# Patient Record
Sex: Female | Born: 1935
Health system: Southern US, Community
[De-identification: ages and names within clinical notes are randomized; demographics above are authoritative.]

## PROBLEM LIST (undated history)

## (undated) DIAGNOSIS — R55 Syncope and collapse: Secondary | ICD-10-CM

## (undated) DIAGNOSIS — E042 Nontoxic multinodular goiter: Secondary | ICD-10-CM

## (undated) DIAGNOSIS — D72819 Decreased white blood cell count, unspecified: Secondary | ICD-10-CM

## (undated) DIAGNOSIS — I10 Essential (primary) hypertension: Secondary | ICD-10-CM

## (undated) DIAGNOSIS — D649 Anemia, unspecified: Secondary | ICD-10-CM

## (undated) DIAGNOSIS — K5792 Diverticulitis of intestine, part unspecified, without perforation or abscess without bleeding: Secondary | ICD-10-CM

## (undated) DIAGNOSIS — G4730 Sleep apnea, unspecified: Secondary | ICD-10-CM

## (undated) DIAGNOSIS — E785 Hyperlipidemia, unspecified: Secondary | ICD-10-CM

## (undated) DIAGNOSIS — L84 Corns and callosities: Secondary | ICD-10-CM

## (undated) DIAGNOSIS — F419 Anxiety disorder, unspecified: Secondary | ICD-10-CM

## (undated) DIAGNOSIS — G4710 Hypersomnia, unspecified: Secondary | ICD-10-CM

## (undated) DIAGNOSIS — Z973 Presence of spectacles and contact lenses: Secondary | ICD-10-CM

## (undated) DIAGNOSIS — K219 Gastro-esophageal reflux disease without esophagitis: Secondary | ICD-10-CM

## (undated) HISTORY — DX: Nontoxic multinodular goiter: E04.2

## (undated) HISTORY — DX: Hypersomnia, unspecified: G47.10

## (undated) HISTORY — DX: Gastro-esophageal reflux disease without esophagitis: K21.9

## (undated) HISTORY — DX: Corns and callosities: L84

## (undated) HISTORY — DX: Sleep apnea, unspecified: G47.30

## (undated) HISTORY — PX: CATARACT EXTRACTION: SUR2

## (undated) HISTORY — PX: TOTAL ABDOMINAL HYSTERECTOMY: SHX209

## (undated) HISTORY — DX: Diverticulitis of intestine, part unspecified, without perforation or abscess without bleeding: K57.92

## (undated) HISTORY — DX: Anxiety disorder, unspecified: F41.9

## (undated) HISTORY — DX: Essential (primary) hypertension: I10

## (undated) HISTORY — DX: Decreased white blood cell count, unspecified: D72.819

## (undated) HISTORY — DX: Hyperlipidemia, unspecified: E78.5

## (undated) HISTORY — DX: Syncope and collapse: R55

## (undated) HISTORY — DX: Anemia, unspecified: D64.9

---

## 1989-03-30 HISTORY — PX: FOOT SURGERY: SHX648

## 1997-11-27 LAB — HM MAMMOGRAPHY

## 1998-04-28 ENCOUNTER — Ambulatory Visit (HOSPITAL_COMMUNITY): Admission: RE | Admit: 1998-04-28 | Discharge: 1998-04-28 | Payer: Self-pay | Admitting: Gastroenterology

## 1998-04-29 LAB — HM COLONOSCOPY

## 1998-05-11 ENCOUNTER — Emergency Department (HOSPITAL_COMMUNITY): Admission: EM | Admit: 1998-05-11 | Discharge: 1998-05-11 | Payer: Self-pay | Admitting: Emergency Medicine

## 2000-09-11 ENCOUNTER — Other Ambulatory Visit: Admission: RE | Admit: 2000-09-11 | Discharge: 2000-09-11 | Payer: Self-pay | Admitting: Internal Medicine

## 2001-11-19 ENCOUNTER — Encounter: Payer: Self-pay | Admitting: Family Medicine

## 2001-11-19 ENCOUNTER — Encounter: Admission: RE | Admit: 2001-11-19 | Discharge: 2001-11-19 | Payer: Self-pay | Admitting: Family Medicine

## 2004-07-04 ENCOUNTER — Ambulatory Visit: Payer: Self-pay | Admitting: Internal Medicine

## 2004-08-16 ENCOUNTER — Ambulatory Visit: Payer: Self-pay | Admitting: Internal Medicine

## 2005-02-10 ENCOUNTER — Ambulatory Visit: Payer: Self-pay | Admitting: Family Medicine

## 2005-03-01 ENCOUNTER — Ambulatory Visit: Payer: Self-pay | Admitting: Internal Medicine

## 2005-04-04 ENCOUNTER — Ambulatory Visit: Payer: Self-pay | Admitting: Internal Medicine

## 2005-05-02 ENCOUNTER — Ambulatory Visit: Payer: Self-pay | Admitting: Internal Medicine

## 2005-06-01 ENCOUNTER — Ambulatory Visit: Payer: Self-pay | Admitting: Internal Medicine

## 2005-07-02 ENCOUNTER — Ambulatory Visit: Payer: Self-pay | Admitting: Internal Medicine

## 2005-11-09 ENCOUNTER — Ambulatory Visit: Payer: Self-pay | Admitting: Internal Medicine

## 2005-11-19 ENCOUNTER — Ambulatory Visit: Payer: Self-pay | Admitting: Cardiovascular Disease

## 2005-12-10 ENCOUNTER — Encounter: Payer: Self-pay | Admitting: Cardiology

## 2005-12-10 ENCOUNTER — Ambulatory Visit: Payer: Self-pay

## 2005-12-10 ENCOUNTER — Encounter: Payer: Self-pay | Admitting: Internal Medicine

## 2006-02-14 ENCOUNTER — Emergency Department (HOSPITAL_COMMUNITY): Admission: EM | Admit: 2006-02-14 | Discharge: 2006-02-14 | Payer: Self-pay | Admitting: Emergency Medicine

## 2006-02-15 ENCOUNTER — Ambulatory Visit: Payer: Self-pay | Admitting: Internal Medicine

## 2006-03-11 ENCOUNTER — Ambulatory Visit: Payer: Self-pay | Admitting: Internal Medicine

## 2006-03-20 ENCOUNTER — Ambulatory Visit: Payer: Self-pay | Admitting: Internal Medicine

## 2006-06-26 ENCOUNTER — Ambulatory Visit: Payer: Self-pay | Admitting: Internal Medicine

## 2006-06-26 LAB — CONVERTED CEMR LAB
ALT: 15 units/L (ref 0–40)
AST: 26 units/L (ref 0–37)
Basophils Absolute: 0 10*3/uL (ref 0.0–0.1)
Basophils Relative: 1.2 % — ABNORMAL HIGH (ref 0.0–1.0)
Chol/HDL Ratio, serum: 6
Cholesterol: 304 mg/dL (ref 0–200)
Creatinine, Ser: 1.1 mg/dL (ref 0.4–1.2)
Eosinophil percent: 3.3 % (ref 0.0–5.0)
Glucose, Bld: 99 mg/dL (ref 70–99)
HCT: 38 % (ref 36.0–46.0)
HDL: 50.5 mg/dL (ref >39.0)
Hemoglobin: 12.4 g/dL (ref 12.0–15.0)
LDL DIRECT: 224.3 mg/dL
Lymphocytes Relative: 47.5 % — ABNORMAL HIGH (ref 12.0–46.0)
MCHC: 32.6 g/dL (ref 30.0–36.0)
MCV: 84.4 fL (ref 78.0–100.0)
Monocytes Absolute: 0.4 10*3/uL (ref 0.2–0.7)
Monocytes Relative: 10.8 % (ref 3.0–11.0)
Neutro Abs: 1.3 10*3/uL — ABNORMAL LOW (ref 1.4–7.7)
Neutrophils Relative %: 37.2 % — ABNORMAL LOW (ref 43.0–77.0)
Platelets: 206 10*3/uL (ref 150–400)
RBC: 4.5 M/uL (ref 3.87–5.11)
RDW: 13.5 % (ref 11.5–14.6)
TSH: 2.01 microintl units/mL (ref 0.35–5.50)
Triglyceride fasting, serum: 116 mg/dL (ref 0–149)
VLDL: 23 mg/dL (ref 0–40)
Vitamin B-12: 582 pg/mL (ref 211–911)
WBC: 3.5 10*3/uL — ABNORMAL LOW (ref 4.5–10.5)

## 2006-08-13 ENCOUNTER — Ambulatory Visit: Payer: Self-pay | Admitting: Internal Medicine

## 2006-08-13 LAB — CONVERTED CEMR LAB
ALT: 20 units/L (ref 0–40)
AST: 31 units/L (ref 0–37)
Cholesterol: 198 mg/dL (ref 0–200)
HDL: 45.8 mg/dL (ref >39.0)
LDL Cholesterol: 119 mg/dL — ABNORMAL HIGH (ref 0–99)
Total CHOL/HDL Ratio: 4.3
Triglycerides: 168 mg/dL — ABNORMAL HIGH (ref 0–149)
VLDL: 34 mg/dL (ref 0–40)

## 2006-09-13 ENCOUNTER — Ambulatory Visit: Payer: Self-pay | Admitting: Internal Medicine

## 2006-10-10 ENCOUNTER — Ambulatory Visit: Payer: Self-pay | Admitting: Internal Medicine

## 2006-12-18 ENCOUNTER — Ambulatory Visit: Payer: Self-pay | Admitting: Internal Medicine

## 2006-12-18 LAB — CONVERTED CEMR LAB
Albumin ELP: 59.8 % (ref 55.8–66.1)
Alpha-1-Globulin: 3.6 % (ref 2.9–4.9)
Alpha-2-Globulin: 8.4 % (ref 7.1–11.8)
BUN: 9 mg/dL (ref 6–23)
Basophils Absolute: 0 10*3/uL (ref 0.0–0.1)
Basophils Relative: 1.1 % — ABNORMAL HIGH (ref 0.0–1.0)
Beta Globulin: 6.1 % (ref 4.7–7.2)
CO2: 32 meq/L (ref 19–32)
Calcium: 9.4 mg/dL (ref 8.4–10.5)
Chloride: 110 meq/L (ref 96–112)
Creatinine, Ser: 1 mg/dL (ref 0.4–1.2)
Eosinophils Absolute: 0.1 10*3/uL (ref 0.0–0.6)
Eosinophils Relative: 4.3 % (ref 0.0–5.0)
GFR calc Af Amer: 69 mL/min
GFR calc non Af Amer: 57 mL/min
Gamma Globulin: 16.1 % (ref 11.1–18.8)
Glucose, Bld: 83 mg/dL (ref 70–99)
HCT: 34.8 % — ABNORMAL LOW (ref 36.0–46.0)
Hemoglobin: 11.6 g/dL — ABNORMAL LOW (ref 12.0–15.0)
IgA: 312 mg/dL (ref 68–378)
IgG (Immunoglobin G), Serum: 1190 mg/dL (ref 694–1618)
IgM, Serum: 42 mg/dL — ABNORMAL LOW (ref 60–263)
Lymphocytes Relative: 45.1 % (ref 12.0–46.0)
MCHC: 33.4 g/dL (ref 30.0–36.0)
MCV: 83.1 fL (ref 78.0–100.0)
Monocytes Absolute: 0.3 10*3/uL (ref 0.2–0.7)
Monocytes Relative: 11.2 % — ABNORMAL HIGH (ref 3.0–11.0)
Neutro Abs: 1.1 10*3/uL — ABNORMAL LOW (ref 1.4–7.7)
Neutrophils Relative %: 38.3 % — ABNORMAL LOW (ref 43.0–77.0)
Platelets: 185 10*3/uL (ref 150–400)
Potassium: 4.2 meq/L (ref 3.5–5.1)
RBC: 4.19 M/uL (ref 3.87–5.11)
RDW: 14.1 % (ref 11.5–14.6)
Sodium: 147 meq/L — ABNORMAL HIGH (ref 135–145)
Total Protein, Serum Electrophoresis: 7.4 g/dL (ref 6.0–8.3)
Vit D, 1,25-Dihydroxy: 24 (ref 20–57)
WBC: 3 10*3/uL — ABNORMAL LOW (ref 4.5–10.5)

## 2007-01-29 ENCOUNTER — Ambulatory Visit: Payer: Self-pay | Admitting: Internal Medicine

## 2007-03-04 DIAGNOSIS — I10 Essential (primary) hypertension: Secondary | ICD-10-CM | POA: Insufficient documentation

## 2007-03-04 DIAGNOSIS — E785 Hyperlipidemia, unspecified: Secondary | ICD-10-CM

## 2007-03-04 DIAGNOSIS — F411 Generalized anxiety disorder: Secondary | ICD-10-CM | POA: Insufficient documentation

## 2007-03-04 DIAGNOSIS — D649 Anemia, unspecified: Secondary | ICD-10-CM

## 2007-03-04 DIAGNOSIS — Z8719 Personal history of other diseases of the digestive system: Secondary | ICD-10-CM

## 2007-04-02 ENCOUNTER — Ambulatory Visit: Payer: Self-pay | Admitting: Internal Medicine

## 2007-04-02 LAB — CONVERTED CEMR LAB
Cholesterol: 189 mg/dL (ref 0–200)
HDL: 43.6 mg/dL (ref >39.0)
LDL Cholesterol: 124 mg/dL — ABNORMAL HIGH (ref 0–99)
Total CHOL/HDL Ratio: 4.3
Triglycerides: 107 mg/dL (ref 0–149)
VLDL: 21 mg/dL (ref 0–40)
Vit D, 1,25-Dihydroxy: 34 (ref 20–57)

## 2007-04-09 ENCOUNTER — Ambulatory Visit: Payer: Self-pay | Admitting: Internal Medicine

## 2007-04-09 DIAGNOSIS — R404 Transient alteration of awareness: Secondary | ICD-10-CM

## 2007-04-09 DIAGNOSIS — J309 Allergic rhinitis, unspecified: Secondary | ICD-10-CM | POA: Insufficient documentation

## 2007-05-06 ENCOUNTER — Encounter: Payer: Self-pay | Admitting: Internal Medicine

## 2007-07-02 ENCOUNTER — Ambulatory Visit: Payer: Self-pay | Admitting: Internal Medicine

## 2007-07-10 ENCOUNTER — Ambulatory Visit: Payer: Self-pay | Admitting: Pulmonary Disease

## 2007-07-10 DIAGNOSIS — G4733 Obstructive sleep apnea (adult) (pediatric): Secondary | ICD-10-CM | POA: Insufficient documentation

## 2007-07-15 ENCOUNTER — Encounter: Payer: Self-pay | Admitting: Pulmonary Disease

## 2007-07-15 ENCOUNTER — Ambulatory Visit (HOSPITAL_BASED_OUTPATIENT_CLINIC_OR_DEPARTMENT_OTHER): Admission: RE | Admit: 2007-07-15 | Discharge: 2007-07-15 | Payer: Self-pay | Admitting: Pulmonary Disease

## 2007-07-21 ENCOUNTER — Ambulatory Visit: Payer: Self-pay | Admitting: Pulmonary Disease

## 2007-07-29 ENCOUNTER — Ambulatory Visit: Payer: Self-pay | Admitting: Pulmonary Disease

## 2007-07-29 DIAGNOSIS — G4730 Sleep apnea, unspecified: Secondary | ICD-10-CM

## 2007-07-29 DIAGNOSIS — G4710 Hypersomnia, unspecified: Secondary | ICD-10-CM | POA: Insufficient documentation

## 2007-08-01 ENCOUNTER — Telehealth (INDEPENDENT_AMBULATORY_CARE_PROVIDER_SITE_OTHER): Payer: Self-pay | Admitting: *Deleted

## 2007-08-13 ENCOUNTER — Telehealth: Payer: Self-pay | Admitting: Pulmonary Disease

## 2007-08-22 ENCOUNTER — Encounter: Payer: Self-pay | Admitting: Pulmonary Disease

## 2007-09-16 ENCOUNTER — Ambulatory Visit: Payer: Self-pay | Admitting: Pulmonary Disease

## 2007-09-18 ENCOUNTER — Ambulatory Visit: Payer: Self-pay | Admitting: Internal Medicine

## 2007-09-18 ENCOUNTER — Encounter: Payer: Self-pay | Admitting: Pulmonary Disease

## 2007-09-21 LAB — CONVERTED CEMR LAB
ALT: 21 units/L (ref 0–35)
AST: 36 units/L (ref 0–37)
Albumin: 3.6 g/dL (ref 3.5–5.2)
Alkaline Phosphatase: 90 units/L (ref 39–117)
BUN: 9 mg/dL (ref 6–23)
Basophils Absolute: 0 10*3/uL (ref 0.0–0.1)
Basophils Relative: 0.3 % (ref 0.0–1.0)
Bilirubin, Direct: 0.1 mg/dL (ref 0.0–0.3)
CO2: 30 meq/L (ref 19–32)
Calcium: 9.6 mg/dL (ref 8.4–10.5)
Chloride: 104 meq/L (ref 96–112)
Creatinine, Ser: 1 mg/dL (ref 0.4–1.2)
Eosinophils Absolute: 0.1 10*3/uL (ref 0.0–0.6)
Eosinophils Relative: 2.7 % (ref 0.0–5.0)
GFR calc Af Amer: 69 mL/min
GFR calc non Af Amer: 57 mL/min
Glucose, Bld: 94 mg/dL (ref 70–99)
HCT: 34.6 % — ABNORMAL LOW (ref 36.0–46.0)
Hemoglobin: 10.9 g/dL — ABNORMAL LOW (ref 12.0–15.0)
Lymphocytes Relative: 46.6 % — ABNORMAL HIGH (ref 12.0–46.0)
MCHC: 31.6 g/dL (ref 30.0–36.0)
MCV: 85.3 fL (ref 78.0–100.0)
Monocytes Absolute: 0.5 10*3/uL (ref 0.2–0.7)
Monocytes Relative: 11.8 % — ABNORMAL HIGH (ref 3.0–11.0)
Neutro Abs: 1.5 10*3/uL (ref 1.4–7.7)
Neutrophils Relative %: 38.6 % — ABNORMAL LOW (ref 43.0–77.0)
Platelets: 205 10*3/uL (ref 150–400)
Potassium: 3.7 meq/L (ref 3.5–5.1)
RBC: 4.05 M/uL (ref 3.87–5.11)
RDW: 14.2 % (ref 11.5–14.6)
Sodium: 143 meq/L (ref 135–145)
TSH: 2.11 microintl units/mL (ref 0.35–5.50)
Total Bilirubin: 0.6 mg/dL (ref 0.3–1.2)
Total Protein: 6.4 g/dL (ref 6.0–8.3)
WBC: 3.9 10*3/uL — ABNORMAL LOW (ref 4.5–10.5)

## 2007-09-24 ENCOUNTER — Ambulatory Visit: Payer: Self-pay | Admitting: Internal Medicine

## 2007-10-20 ENCOUNTER — Ambulatory Visit: Payer: Self-pay | Admitting: Gastroenterology

## 2007-11-04 ENCOUNTER — Telehealth (INDEPENDENT_AMBULATORY_CARE_PROVIDER_SITE_OTHER): Payer: Self-pay | Admitting: *Deleted

## 2007-11-26 ENCOUNTER — Telehealth: Payer: Self-pay | Admitting: Internal Medicine

## 2007-12-08 ENCOUNTER — Ambulatory Visit: Payer: Self-pay | Admitting: Internal Medicine

## 2008-02-05 ENCOUNTER — Ambulatory Visit: Payer: Self-pay | Admitting: Internal Medicine

## 2008-02-12 ENCOUNTER — Encounter: Payer: Self-pay | Admitting: Internal Medicine

## 2008-04-07 ENCOUNTER — Ambulatory Visit: Payer: Self-pay | Admitting: Internal Medicine

## 2008-04-07 ENCOUNTER — Telehealth: Payer: Self-pay | Admitting: *Deleted

## 2008-04-07 LAB — CONVERTED CEMR LAB
BUN: 10 mg/dL (ref 6–23)
Basophils Relative: 0.4 % (ref 0.0–3.0)
Calcium: 9.3 mg/dL (ref 8.4–10.5)
Creatinine, Ser: 1 mg/dL (ref 0.4–1.2)
Eosinophils Absolute: 0.1 10*3/uL (ref 0.0–0.7)
Eosinophils Relative: 2.2 % (ref 0.0–5.0)
GFR calc Af Amer: 69 mL/min
GFR calc non Af Amer: 57 mL/min
Glucose, Bld: 98 mg/dL (ref 70–99)
HCT: 38 % (ref 36.0–46.0)
Hemoglobin: 12.2 g/dL (ref 12.0–15.0)
MCV: 85.5 fL (ref 78.0–100.0)
Monocytes Absolute: 0.4 10*3/uL (ref 0.1–1.0)
Neutro Abs: 1.5 10*3/uL (ref 1.4–7.7)
Platelets: 200 10*3/uL (ref 150–400)
Potassium: 4 meq/L (ref 3.5–5.1)
WBC: 3.4 10*3/uL — ABNORMAL LOW (ref 4.5–10.5)

## 2008-04-14 ENCOUNTER — Ambulatory Visit: Payer: Self-pay | Admitting: Internal Medicine

## 2008-04-27 ENCOUNTER — Telehealth: Payer: Self-pay | Admitting: Internal Medicine

## 2008-05-12 ENCOUNTER — Encounter: Payer: Self-pay | Admitting: Internal Medicine

## 2008-07-07 ENCOUNTER — Telehealth: Payer: Self-pay | Admitting: *Deleted

## 2008-07-30 DIAGNOSIS — R55 Syncope and collapse: Secondary | ICD-10-CM

## 2008-07-30 HISTORY — DX: Syncope and collapse: R55

## 2008-08-10 ENCOUNTER — Ambulatory Visit: Payer: Self-pay | Admitting: Internal Medicine

## 2008-09-10 ENCOUNTER — Telehealth: Payer: Self-pay | Admitting: Internal Medicine

## 2008-10-14 ENCOUNTER — Encounter: Payer: Self-pay | Admitting: Pulmonary Disease

## 2008-10-27 ENCOUNTER — Encounter: Payer: Self-pay | Admitting: Pulmonary Disease

## 2008-11-03 ENCOUNTER — Telehealth: Payer: Self-pay | Admitting: *Deleted

## 2008-11-18 ENCOUNTER — Ambulatory Visit: Payer: Self-pay | Admitting: Internal Medicine

## 2008-11-18 LAB — CONVERTED CEMR LAB
ALT: 21 units/L (ref 0–35)
Albumin: 3.7 g/dL (ref 3.5–5.2)
Cholesterol: 220 mg/dL — ABNORMAL HIGH (ref 0–200)
HDL: 47.9 mg/dL (ref >39.00)
Total Protein: 7.2 g/dL (ref 6.0–8.3)
Triglycerides: 106 mg/dL (ref 0.0–149.0)

## 2008-12-01 ENCOUNTER — Ambulatory Visit: Payer: Self-pay | Admitting: Internal Medicine

## 2008-12-07 ENCOUNTER — Ambulatory Visit: Payer: Self-pay | Admitting: Pulmonary Disease

## 2009-03-16 ENCOUNTER — Ambulatory Visit: Payer: Self-pay | Admitting: Internal Medicine

## 2009-03-16 LAB — CONVERTED CEMR LAB
AST: 30 units/L (ref 0–37)
Alkaline Phosphatase: 97 units/L (ref 39–117)
Basophils Absolute: 0 10*3/uL (ref 0.0–0.1)
Bilirubin Urine: NEGATIVE
Bilirubin, Direct: 0.1 mg/dL (ref 0.0–0.3)
Blood in Urine, dipstick: NEGATIVE
CO2: 31 meq/L (ref 19–32)
Calcium: 9 mg/dL (ref 8.4–10.5)
Cholesterol: 215 mg/dL — ABNORMAL HIGH (ref 0–200)
Creatinine, Ser: 1.1 mg/dL (ref 0.4–1.2)
Eosinophils Absolute: 0.1 10*3/uL (ref 0.0–0.7)
GFR calc non Af Amer: 61.43 mL/min (ref >60)
Glucose, Bld: 95 mg/dL (ref 70–99)
HDL: 49.3 mg/dL (ref >39.00)
Hemoglobin: 11.7 g/dL — ABNORMAL LOW (ref 12.0–15.0)
Ketones, urine, test strip: NEGATIVE
Lymphocytes Relative: 53.8 % — ABNORMAL HIGH (ref 12.0–46.0)
MCHC: 32.7 g/dL (ref 30.0–36.0)
Monocytes Relative: 10.6 % (ref 3.0–12.0)
Neutrophils Relative %: 31.1 % — ABNORMAL LOW (ref 43.0–77.0)
Nitrite: NEGATIVE
RBC: 4.16 M/uL (ref 3.87–5.11)
RDW: 14.2 % (ref 11.5–14.6)
Sodium: 150 meq/L — ABNORMAL HIGH (ref 135–145)
Total CHOL/HDL Ratio: 4
Triglycerides: 97 mg/dL (ref 0.0–149.0)
Urobilinogen, UA: 0.2

## 2009-03-24 ENCOUNTER — Ambulatory Visit: Payer: Self-pay | Admitting: Internal Medicine

## 2009-03-24 DIAGNOSIS — R799 Abnormal finding of blood chemistry, unspecified: Secondary | ICD-10-CM

## 2009-03-24 DIAGNOSIS — D72819 Decreased white blood cell count, unspecified: Secondary | ICD-10-CM | POA: Insufficient documentation

## 2009-03-25 LAB — CONVERTED CEMR LAB
BUN: 12 mg/dL (ref 6–23)
CO2: 33 meq/L — ABNORMAL HIGH (ref 19–32)
Chloride: 108 meq/L (ref 96–112)
GFR calc non Af Amer: 68.57 mL/min (ref >60)
Glucose, Bld: 95 mg/dL (ref 70–99)
Potassium: 4.2 meq/L (ref 3.5–5.1)
Sodium: 146 meq/L — ABNORMAL HIGH (ref 135–145)

## 2009-05-11 ENCOUNTER — Telehealth: Payer: Self-pay | Admitting: *Deleted

## 2009-05-13 ENCOUNTER — Encounter: Payer: Self-pay | Admitting: Internal Medicine

## 2009-06-06 ENCOUNTER — Ambulatory Visit: Payer: Self-pay | Admitting: Internal Medicine

## 2009-06-06 ENCOUNTER — Encounter (INDEPENDENT_AMBULATORY_CARE_PROVIDER_SITE_OTHER): Payer: Self-pay | Admitting: *Deleted

## 2009-06-07 ENCOUNTER — Ambulatory Visit (HOSPITAL_COMMUNITY): Admission: RE | Admit: 2009-06-07 | Discharge: 2009-06-07 | Payer: Self-pay | Admitting: Internal Medicine

## 2009-06-07 ENCOUNTER — Ambulatory Visit: Payer: Self-pay | Admitting: Cardiology

## 2009-06-07 ENCOUNTER — Encounter: Payer: Self-pay | Admitting: Internal Medicine

## 2009-06-07 ENCOUNTER — Ambulatory Visit: Payer: Self-pay

## 2009-06-20 ENCOUNTER — Ambulatory Visit: Payer: Self-pay | Admitting: Internal Medicine

## 2009-06-27 ENCOUNTER — Telehealth: Payer: Self-pay | Admitting: *Deleted

## 2009-07-04 ENCOUNTER — Ambulatory Visit: Payer: Self-pay | Admitting: Internal Medicine

## 2009-07-07 ENCOUNTER — Encounter: Payer: Self-pay | Admitting: *Deleted

## 2009-07-07 LAB — CONVERTED CEMR LAB
Basophils Absolute: 0 10*3/uL (ref 0.0–0.1)
HCT: 37.2 % (ref 36.0–46.0)
HDL: 63.3 mg/dL (ref >39.00)
LDL Cholesterol: 101 mg/dL — ABNORMAL HIGH (ref 0–99)
Lymphs Abs: 1.5 10*3/uL (ref 0.7–4.0)
Monocytes Relative: 11.5 % (ref 3.0–12.0)
Neutrophils Relative %: 44.1 % (ref 43.0–77.0)
Platelets: 201 10*3/uL (ref 150.0–400.0)
RDW: 14.9 % — ABNORMAL HIGH (ref 11.5–14.6)
Total CHOL/HDL Ratio: 3
Triglycerides: 91 mg/dL (ref 0.0–149.0)
VLDL: 18.2 mg/dL (ref 0.0–40.0)

## 2009-07-11 ENCOUNTER — Telehealth: Payer: Self-pay | Admitting: *Deleted

## 2009-07-30 DIAGNOSIS — E042 Nontoxic multinodular goiter: Secondary | ICD-10-CM

## 2009-07-30 HISTORY — DX: Nontoxic multinodular goiter: E04.2

## 2009-08-02 ENCOUNTER — Telehealth: Payer: Self-pay | Admitting: Internal Medicine

## 2009-08-08 ENCOUNTER — Telehealth: Payer: Self-pay | Admitting: *Deleted

## 2009-09-30 ENCOUNTER — Ambulatory Visit: Payer: Self-pay | Admitting: Internal Medicine

## 2009-10-06 ENCOUNTER — Ambulatory Visit: Payer: Self-pay | Admitting: Internal Medicine

## 2009-10-25 ENCOUNTER — Ambulatory Visit: Payer: Self-pay | Admitting: Pulmonary Disease

## 2009-12-28 ENCOUNTER — Telehealth: Payer: Self-pay | Admitting: *Deleted

## 2009-12-29 ENCOUNTER — Ambulatory Visit: Payer: Self-pay | Admitting: Internal Medicine

## 2009-12-30 LAB — CONVERTED CEMR LAB
Basophils Relative: 0.8 % (ref 0.0–3.0)
CO2: 33 meq/L — ABNORMAL HIGH (ref 19–32)
Calcium: 9.4 mg/dL (ref 8.4–10.5)
GFR calc non Af Amer: 63.99 mL/min (ref >60)
HCT: 37.1 % (ref 36.0–46.0)
Hemoglobin: 12.2 g/dL (ref 12.0–15.0)
Lymphocytes Relative: 41.2 % (ref 12.0–46.0)
MCHC: 32.8 g/dL (ref 30.0–36.0)
Monocytes Relative: 11 % (ref 3.0–12.0)
Neutro Abs: 1.8 10*3/uL (ref 1.4–7.7)
Potassium: 3.8 meq/L (ref 3.5–5.1)
RBC: 4.31 M/uL (ref 3.87–5.11)
Sodium: 146 meq/L — ABNORMAL HIGH (ref 135–145)

## 2010-01-02 ENCOUNTER — Telehealth: Payer: Self-pay | Admitting: *Deleted

## 2010-01-05 ENCOUNTER — Telehealth: Payer: Self-pay | Admitting: Internal Medicine

## 2010-01-11 ENCOUNTER — Telehealth: Payer: Self-pay | Admitting: Internal Medicine

## 2010-01-13 ENCOUNTER — Encounter: Payer: Self-pay | Admitting: Internal Medicine

## 2010-01-16 ENCOUNTER — Encounter: Payer: Self-pay | Admitting: Internal Medicine

## 2010-01-16 ENCOUNTER — Ambulatory Visit: Payer: Self-pay

## 2010-01-25 ENCOUNTER — Ambulatory Visit: Payer: Self-pay | Admitting: Internal Medicine

## 2010-02-09 ENCOUNTER — Ambulatory Visit: Payer: Self-pay | Admitting: Endocrinology

## 2010-02-09 DIAGNOSIS — E042 Nontoxic multinodular goiter: Secondary | ICD-10-CM

## 2010-02-23 ENCOUNTER — Ambulatory Visit: Payer: Self-pay | Admitting: Internal Medicine

## 2010-02-23 DIAGNOSIS — R5381 Other malaise: Secondary | ICD-10-CM | POA: Insufficient documentation

## 2010-02-23 DIAGNOSIS — R5383 Other fatigue: Secondary | ICD-10-CM

## 2010-05-16 ENCOUNTER — Encounter: Payer: Self-pay | Admitting: Internal Medicine

## 2010-05-25 ENCOUNTER — Encounter: Payer: Self-pay | Admitting: Internal Medicine

## 2010-05-25 ENCOUNTER — Ambulatory Visit: Payer: Self-pay | Admitting: Internal Medicine

## 2010-05-25 ENCOUNTER — Encounter (INDEPENDENT_AMBULATORY_CARE_PROVIDER_SITE_OTHER): Payer: Self-pay | Admitting: *Deleted

## 2010-05-25 DIAGNOSIS — K219 Gastro-esophageal reflux disease without esophagitis: Secondary | ICD-10-CM

## 2010-05-30 LAB — CONVERTED CEMR LAB
Basophils Absolute: 0 10*3/uL (ref 0.0–0.1)
Basophils Relative: 1 % (ref 0.0–3.0)
Calcium: 8.9 mg/dL (ref 8.4–10.5)
Direct LDL: 117.3 mg/dL
GFR calc non Af Amer: 64.63 mL/min (ref >60)
HDL: 55.2 mg/dL (ref >39.00)
Hemoglobin: 11.6 g/dL — ABNORMAL LOW (ref 12.0–15.0)
Lymphocytes Relative: 44.5 % (ref 12.0–46.0)
Magnesium: 2.1 mg/dL (ref 1.5–2.5)
Monocytes Relative: 10.7 % (ref 3.0–12.0)
Neutro Abs: 1.4 10*3/uL (ref 1.4–7.7)
RBC: 4.03 M/uL (ref 3.87–5.11)
Sodium: 138 meq/L (ref 135–145)
Total CHOL/HDL Ratio: 4
Triglycerides: 117 mg/dL (ref 0.0–149.0)
WBC: 3.4 10*3/uL — ABNORMAL LOW (ref 4.5–10.5)

## 2010-08-31 NOTE — Assessment & Plan Note (Signed)
Summary: 3 Month ROV   Vital Signs:  Patient profile:   77 year old female Menstrual status:  hysterectomy Weight:      158 pounds Pulse rate:   78 / minute BP sitting:   140 / 90  (left arm) Cuff size:   regular  Vitals Entered By: [name], CMA (AAMA) (May 25, 2010 8:18 AM)  Serial Vital Signs/Assessments:  Time      Position  BP       Pulse  Resp  Temp     By                     130/78                         Madelin Headings MD  Comments: right arm sitting  By: Madelin Headings MD   CC: 3 Month Rov, Hypertension Management   History of Present Illness: Cassandra English comes in today  for  return office visit  for multiple med issues . Since last visit  she has done well trying to lose weight and feels some better.  To get cataract surgery   next month.   HT: taking 1/2 cozaar now no new se .  no change in edema .  LIPIDS on lipitor  GERD     stable on prillosec  Fatigue some better   taking 1/2 lexapro per day OSA  On machine  cpap  Started back walking.   no falling.  losing weight   on pourpose   and feels better  .    Hypertension History:      She complains of peripheral edema, but denies headache, chest pain, palpitations, dyspnea with exertion, orthopnea, PND, visual symptoms, neurologic problems, syncope, and side effects from treatment.  She notes no problems with any antihypertensive medication side effects.  Ankles swelling.        Positive major cardiovascular risk factors include female age 3 years old or older, hyperlipidemia, and hypertension.  Negative major cardiovascular risk factors include non-tobacco-user status.     Preventive Screening-Counseling & Management  Alcohol-Tobacco     Alcohol drinks/day: 0     Smoking Status: never  Caffeine-Diet-Exercise     Caffeine use/day: Green Tea 1     Does Patient Exercise: yes     Type of exercise: Walking     Times/week: 4     MSH Depression Score: no  Safety-Violence-Falls  Seat Belt Use: 100     Fall Risk: no  Current Medications (verified): 1)  Bayer Aspirin 325 Mg Tabs (Aspirin) .... Take 1 Tablet By Mouth Once A Day 2)  Cozaar 50 Mg Tabs (Losartan Potassium) .... 1/3  of  Tab Once Daily 3)  Lexapro 10 Mg Tabs (Escitalopram Oxalate) .... Take 1/2 Tablet By Mouth Every Other Day For A Week and Then Stop or Use As Needed 4)  Prilosec Otc 20 Mg Tbec (Omeprazole Magnesium) 5)  Vitamin D 400 Unit  Caps (Cholecalciferol) .... Take One Tablet By Mouth Once Daily. 6)  Cpap .... At Bedtime 7)  Lipitor 20 Mg Tabs (Atorvastatin Calcium) .... 1/2  By Mouth Once Daily 8)  Allergy Vaccine .... As Directed Per Dr. [name] 9)  Multivitamins   Tabs (Multiple Vitamin)  Allergies (verified): 1)  ! Zithromax 2)  ! Darvocet 3)  Zocor  Past History:  Past medical, surgical, family and social histories (including risk factors) reviewed,  and no changes noted (except as noted below).  Past Medical History: HYPERTENSION (ICD-401.9) HYPERLIPIDEMIA (ICD-272.4) LEUKOPENIA, CHRONIC (ICD-288.50)  hx of  hem consult    years ago.  HYPERSOMNIA, ASSOCIATED WITH SLEEP APNEA (ICD-780.53)- on CPAP ASTHMA (ICD-V17.5) GERD (ICD-530.1) ALLERGIC RHINITIS (ICD-477.9) DIVERTICULITIS, HX OF (ICD-V12.79) ANXIETY (ICD-300.00) ANEMIA-NOS (ICD-285.9)with borderline low wbc and neg heme eval 10 years ago.    Syncope episode  2010  echo ok  Cards consult Multinodular goiter   2011  get yearly [place] Cataracts  Past Surgical History: Reviewed history from 09/24/2007 and no changes required. EGD  10/99, 07/00 echo 06/03  micro [facility] ECHO  2007 colonoscopy, diverticuli/1 perf  10/99 Hysterectomy, TAH  Past History:  Care Management: Allergy: [place] Pulmonary: [name] Gastroenterology: [name] Cardiology: [name]  Endocrinology: [facility] OPTHAL: [facility]  Family History: Reviewed history from 02/09/2010 and no changes required. Asthma Rheumatoid Arthritis both parents had  CAD Sisters had strokes  no goiter or other thyroid problem  Social History: Reviewed history from 07/04/2009 and no changes required. lives alone.  Lives in [place], alone. Divorced Never Smoked.  ETOH- none.  Regular exercise-yes walks      Fall Risk:  no  Review of Systems  The patient denies anorexia, fever, weight gain, decreased hearing, hoarseness, chest pain, syncope, dyspnea on exertion, peripheral edema, prolonged cough, headaches, hemoptysis, abdominal pain, melena, hematochezia, severe indigestion/heartburn, hematuria, transient blindness, difficulty walking, abnormal bleeding, enlarged lymph nodes, and angioedema.         stable mild Edema  Physical Exam  General:  Well-developed,well-nourished,in no acute distress; alert,appropriate and cooperative throughout examination well groomed   and alert  Head:  normocephalic and atraumatic.   Eyes:  vision grossly intact, pupils equal, and pupils round.  glasses Mouth:  pharynx pink and moist.   Neck:  palapble no nodule felt Lungs:  Normal respiratory effort, chest expands symmetrically. Lungs are clear to auscultation, no crackles or wheezes. Heart:  Normal rate and regular rhythm. S1 and S2 normal without gallop, murmur, click, rub or other extra sounds. repeat bp reading 130/78 Abdomen:  Bowel sounds positive,abdomen soft and non-tender without masses, organomegaly or  s noted. Msk:  no joint swelling and no joint warmth.   Pulses:  pulses intact without delay   Extremities:  trace left pedal edema and trace right pedal edema.   Neurologic:  alert & oriented X3, strength normal in all extremities, and gait normal.   ease of gait    walks well and no blanace problems Skin:  turgor normal, color normal, no ecchymoses, and no petechiae.   Cervical Nodes:  No lymphadenopathy noted Psych:  Oriented X3, good eye contact, not anxious appearing, and not depressed appearing.     Impression & Recommendations:  Problem # 1:   HYPERTENSION (ICD-401.9) Assessment Improved  Her updated medication list for this problem includes:    Cozaar 50 Mg Tabs (Losartan potassium) .Marland Kitchen... 1/2  of  tab once daily  Orders: TLB-BMP (Basic Metabolic Panel-BMET) (80048-METABOL) TLB-Magnesium (Mg) (83735-MG) UA Dipstick w/o Micro (automated)  (81003) Specimen Handling (30160) Venipuncture (10932) TLB-BMP (Basic Metabolic Panel-BMET) (80048-METABOL) TLB-Magnesium (Mg) (83735-MG)  BP today: 140/90 Prior BP: 120/80 (02/23/2010)  10 Yr Risk Heart Disease: 9 % Prior 10 Yr Risk Heart Disease: 7 % (02/23/2010)  Labs Reviewed: K+: 3.8 (12/29/2009) Creat: : 1.1 (12/29/2009)   Chol: 182 (07/04/2009)   HDL: 63.30 (07/04/2009)   LDL: 101 (07/04/2009)   TG: 91.0 (07/04/2009)  Problem # 2:  HYPERLIPIDEMIA (ICD-272.4) due for  lab Her updated medication list for this problem includes:    Lipitor 20 Mg Tabs (Atorvastatin calcium) .Marland Kitchen... 1/2  by mouth once daily  Orders: TLB-Lipid Panel (80061-LIPID) TLB-ALT (SGPT) (84460-ALT) TLB-AST (SGOT) (84450-SGOT) TLB-BMP (Basic Metabolic Panel-BMET) (80048-METABOL) TLB-Magnesium (Mg) (83735-MG) Specimen Handling (16109) Venipuncture (60454) TLB-CBC Platelet - w/Differential (85025-CBCD) TLB-Lipid Panel (80061-LIPID) TLB-ALT (SGPT) (84460-ALT) TLB-AST (SGOT) (84450-SGOT) TLB-BMP (Basic Metabolic Panel-BMET) (80048-METABOL) TLB-Magnesium (Mg) (83735-MG)  Problem # 3:  LEUKOPENIA, CHRONIC (ICD-288.50) no infections Orders: TLB-CBC Platelet - w/Differential (85025-CBCD) Specimen Handling (09811) Venipuncture (91478)  Problem # 4:  HYPERSOMNIA, ASSOCIATED WITH SLEEP APNEA (ICD-780.53)  Problem # 5:  GERD (ICD-530.1) Assessment: Unchanged stable   Problem # 6:  ANXIETY (ICD-300.00) sta ble  Her updated medication list for this problem includes:    Lexapro 10 Mg Tabs (Escitalopram oxalate) .Marland Kitchen... Take 1/2 tablet by mouth every other day for a week and then stop or use as  needed  Complete Medication List: 1)  Bayer Aspirin 325 Mg Tabs (Aspirin) .... Take 1 tablet by mouth once a day 2)  Cozaar 50 Mg Tabs (Losartan potassium) .... 1/2  of  tab once daily 3)  Lexapro 10 Mg Tabs (Escitalopram oxalate) .... Take 1/2 tablet by mouth every other day for a week and then stop or use as needed 4)  Prilosec Otc 20 Mg Tbec (Omeprazole magnesium) 5)  Vitamin D 400 Unit Caps (Cholecalciferol) .... Take one tablet by mouth once daily. 6)  Cpap  .... At bedtime 7)  Lipitor 20 Mg Tabs (Atorvastatin calcium) .... 1/2  by mouth once daily 8)  Allergy Vaccine  .... As directed per dr. Dennard Nip Wilkin 9)  Multivitamins Tabs (Multiple vitamin)  Hypertension Assessment/Plan:      The patient's hypertensive risk group is category B: At least one risk factor (excluding diabetes) with no target organ damage.  Her calculated 10 year risk of coronary heart disease is 9 %.  Today's blood pressure is 140/90.  Her blood pressure goal is < 145/90.  Patient Instructions: 1)  You will be informed of lab results when available.  2)  no change in medications  3)  Your blood pressure is good today. 4)  Annual wellness visit in 4-6 months     Orders Added: 1)  TLB-CBC Platelet - w/Differential [85025-CBCD] 2)  TLB-Lipid Panel [80061-LIPID] 3)  TLB-ALT (SGPT) [84460-ALT] 4)  TLB-AST (SGOT) [84450-SGOT] 5)  TLB-BMP (Basic Metabolic Panel-BMET) [80048-METABOL] 6)  TLB-Magnesium (Mg) [83735-MG] 7)  UA Dipstick w/o Micro (automated)  [81003] 8)  Specimen Handling [99000] 9)  Venipuncture [36415] 10)  TLB-CBC Platelet - w/Differential [85025-CBCD] 11)  TLB-Lipid Panel [80061-LIPID] 12)  TLB-ALT (SGPT) [84460-ALT] 13)  TLB-AST (SGOT) [84450-SGOT] 14)  TLB-BMP (Basic Metabolic Panel-BMET) [80048-METABOL] 15)  TLB-Magnesium (Mg) [83735-MG] 16)  Est. Patient Level IV [29562]  Appended Document: Orders Update     Clinical Lists Changes  Orders: Added new Test order of TLB-Lipid Panel  (80061-LIPID) - Signed Added new Test order of TLB-CBC Platelet - w/Differential (85025-CBCD) - Signed Added new Test order of TLB-BMP (Basic Metabolic Panel-BMET) (80048-METABOL) - Signed Added new Test order of TLB-ALT (SGPT) (84460-ALT) - Signed Added new Test order of TLB-AST (SGOT) (84450-SGOT) - Signed Added new Test order of TLB-Magnesium (Mg) (83735-MG) - Signed      Appended Document: Orders Update     Clinical Lists Changes  Observations: Added new observation of COMMENTS: Wynona Canes, CMA  May 25, 2010 1:30 PM  (05/25/2010 13:29) Added new observation  of PH URINE: 5.5  (05/25/2010 13:29) Added new observation of SPEC GR URIN: 1.010  (05/25/2010 13:29) Added new observation of APPEARANCE U: Clear  (05/25/2010 13:29) Added new observation of UA COLOR: yellow  (05/25/2010 13:29) Added new observation of WBC DIPSTK U: 1+  (05/25/2010 13:29) Added new observation of NITRITE URN: negative  (05/25/2010 13:29) Added new observation of UROBILINOGEN: 0.2  (05/25/2010 13:29) Added new observation of PROTEIN, URN: negative  (05/25/2010 13:29) Added new observation of BLOOD UR DIP: trace-intact  (05/25/2010 13:29) Added new observation of KETONES URN: negative  (05/25/2010 13:29) Added new observation of BILIRUBIN UR: negative  (05/25/2010 13:29) Added new observation of GLUCOSE, URN: negative  (05/25/2010 13:29)      Laboratory Results   Urine Tests  Date/Time Recieved: May 25, 2010 1:30 PM  Date/Time Reported: May 25, 2010 1:30 PM   Routine Urinalysis   Color: yellow Appearance: Clear Glucose: negative   (Normal Range: Negative) Bilirubin: negative   (Normal Range: Negative) Ketone: negative   (Normal Range: Negative) Spec. Gravity: 1.010   (Normal Range: 1.003-1.035) Blood: trace-intact   (Normal Range: Negative) pH: 5.5   (Normal Range: 5.0-8.0) Protein: negative   (Normal Range: Negative) Urobilinogen: 0.2   (Normal Range: 0-1) Nitrite:  negative   (Normal Range: Negative) Leukocyte Esterace: 1+   (Normal Range: Negative)    Comments: Wynona Canes, CMA  May 25, 2010 1:30 PM

## 2010-08-31 NOTE — Assessment & Plan Note (Signed)
Summary: nurse visit/ck bp machine/njr   Vital Signs:  Patient profile:   77 year old female Menstrual status:  hysterectomy Pulse rate:   95 / minute BP sitting:   140 / 80  (left arm) Cuff size:   regular  Vitals Entered By: [name], CMA (AAMA) (October 06, 2009 10:27 AM)  Serial Vital Signs/Assessments:  Time      Position  BP       Pulse  Resp  Temp     By 10:31 AM            193/95   95                    [name], CMA (AAMA)                     168/85                         Madelin Headings MD                     160/80                         Madelin Headings MD                     144/82                         Madelin Headings MD                     148/80                         Madelin Headings MD  Comments: 10:31 AM Pt's machine By: [name], CMA (AAMA)  Machine   left arm sitting  By: Madelin Headings MD  reg  office cuff sitting  left arm. By: Madelin Headings MD  machine  By: Madelin Headings MD  office cuff. By: Madelin Headings MD   CC: Pt is here for a BP Check   History of Present Illness: Cassandra English comes in for BP checking... She brings in her machine. Up pwhen initial done . Had 129 this am at home. NO new symptom   Allergies: 1)  ! Zithromax 2)  ! Darvocet 3)  Zocor  Physical Exam  General:  Well-developed,well-nourished,in no acute distress; alert,appropriate and cooperative throughout examination Heart:  normal rate, regular rhythm, no murmur, and no gallop.  see BP readings    Impression & Recommendations:  Problem # 1:  HYPERTENSION (ICD-401.9) see readings come down after a while of sitting and relaxing and her machine correlated pretty well with our machine . Her readings at home are in the 120-140 range and will continue at same regimen to avoid  se of aggressive BP intervention.   Her updated medication list for this problem includes:    Cozaar 100 Mg Tabs (Losartan potassium) .Marland Kitchen... Take 1/2 tablet by  mouth once a day  Complete Medication List: 1)  Bayer Aspirin 325 Mg Tabs (Aspirin) .... Take 1 tablet by mouth once a day 2)  Cozaar 100 Mg Tabs (Losartan potassium) .... Take 1/2 tablet by mouth once a day 3)  Lexapro 10 Mg Tabs (Escitalopram oxalate) .... Take  1/2 tablet by mouth once a day 4)  Prilosec Otc 20 Mg Tbec (Omeprazole magnesium) 5)  Vitamin D 400 Unit Caps (Cholecalciferol) .... Take one tablet by mouth once daily. 6)  Cpap  .... At bedtime 7)  Lipitor 20 Mg Tabs (Atorvastatin calcium) .... 1/2  by mouth once daily 8)  Allergy Vaccine  .... As directed per dr. Dennard Nip Haughton 9)  Multivitamins Tabs (Multiple vitamin)  Patient Instructions: 1)  I think that your machine   is  reading BP accurately enough. to monitor your Blood Pressure. 2)  COntinue to get readings at least 3 x per week.  If 160 or over then call.  3)  Other wise keep your 4 month follow up visit and labs

## 2010-08-31 NOTE — Progress Notes (Signed)
Summary: refill  Phone Note Refill Request Message from:  Fax from Pharmacy  Refills Requested: Medication #1:  COZAAR 100 MG TABS Take 1/2 tablet by mouth once a day [facility] ph---[phone]    fax----[phone]  Initial call taken by: Warnell Forester,  August 08, 2009 12:14 PM    Prescriptions: COZAAR 100 MG TABS (LOSARTAN POTASSIUM) Take 1/2 tablet by mouth once a day  #15 Each x 1   Entered by:   [name], CMA (AAMA)   Authorized by:   Madelin Headings MD   Signed by:   [name], CMA (AAMA) on 08/08/2009   Method used:   Electronically to        [facility]  [phone]* (retail)       [address]       [place], [place]  [address]       Ph: [phone] or [phone]       Fax: [phone]   RxID:   [id number]

## 2010-08-31 NOTE — Progress Notes (Signed)
Summary: needs call  Phone Note Call from Patient Call back at Home Phone [phone]   Caller: VM Call For: Madelin Headings MD Reason for Call: Talk to Nurse Summary of Call: Re BP. BP:  139/90, 145/78, 155/95.164/105,131/88, 116/78, 131/88 Initial call taken by: Rudy Jew, RN,  August 02, 2009 12:21 PM  Follow-up for Phone Call        continue same meds  and monitor bp call if consistently 160 and over  other wise return office visit as before. Follow-up by: Madelin Headings MD,  August 03, 2009 8:36 AM  Additional Follow-up for Phone Call Additional follow up Details #1::        Pt given Dr. Rosezella [place] recommendations. Additional Follow-up by: Lynann Beaver CMA,  August 03, 2009 9:51 AM

## 2010-08-31 NOTE — Progress Notes (Signed)
Summary: samples of lipitor  Phone Note Call from Patient Call back at [facility] Phone [phone]   Caller: Patient Summary of Call: Pt wants samples of lipitor. Can we give her some? Initial call taken by: [name], CMA (AAMA),  December 28, 2009 5:13 PM  Follow-up for Phone Call        yes if we have some Follow-up by: Madelin Headings MD,  December 28, 2009 9:59 PM  Additional Follow-up for Phone Call Additional follow up Details #1::        Samples up front. I tried to call pt but was unable to leave her a message. Additional Follow-up by: [name], CMA Duncan Dull),  December 29, 2009 9:45 AM    Additional Follow-up for Phone Call Additional follow up Details #2::    Pt came in for an appt. Samples given then. Follow-up by: [name], CMA (AAMA),  December 29, 2009 9:56 AM

## 2010-08-31 NOTE — Assessment & Plan Note (Signed)
Summary: 4 month rov/njr   Vital Signs:  Patient profile:   77 year old female Menstrual status:  hysterectomy Weight:      164 pounds Pulse rate:   66 / minute BP sitting:   174 / 90  (left arm) Cuff size:   regular  Vitals Entered By: [name], CMA (AAMA) (January 25, 2010 8:44 AM)  Serial Vital Signs/Assessments:  Time      Position  BP       Pulse  Resp  Temp     By 8:49 AM             169/94   66                    [name], CMA (AAMA)                     165/88                         Madelin Headings MD  Comments: 8:49 AM Pt's machine By: [name], CMA (AAMA)  left arm sitting  office cuff reg By: Madelin Headings MD   CC: Follow-up visit , Hypertension Management, Pt is having alot of fatigue   History of Present Illness: Cassandra English comes in.t  for follow up   for dizzy and  bp issues .  See phone notes .   Since last visit  here  there have been no major changes in health status    but is sleepy all the time and too tired to walk. Taking only a 1/3 of cozaar and taking lexapro 5  q d for the past week. no vision changes weakness  or motor changes  BP readings at home are all good below  140  /90      even this am .   brings in cuff Using  cpap.      Hypertension History:      She complains of peripheral edema, but denies headache, chest pain, palpitations, dyspnea with exertion, orthopnea, PND, visual symptoms, neurologic problems, syncope, and side effects from treatment.  She notes no problems with any antihypertensive medication side effects.        Positive major cardiovascular risk factors include female age 32 years old or older, hyperlipidemia, and hypertension.  Negative major cardiovascular risk factors include non-tobacco-user status.     Preventive Screening-Counseling & Management  Alcohol-Tobacco     Alcohol drinks/day: 0     Smoking Status: never  Caffeine-Diet-Exercise     Caffeine use/day: Green Tea 1  Does Patient Exercise: yes     Type of exercise: Walking     Times/week: 4  Current Medications (verified): 1)  Bayer Aspirin 325 Mg Tabs (Aspirin) .... Take 1 Tablet By Mouth Once A Day 2)  Cozaar 50 Mg Tabs (Losartan Potassium) .... 1/2 Tab Once Daily 3)  Lexapro 10 Mg Tabs (Escitalopram Oxalate) .... Take 1/2 Tablet By Mouth Once A Day 4)  Prilosec Otc 20 Mg Tbec (Omeprazole Magnesium) 5)  Vitamin D 400 Unit  Caps (Cholecalciferol) .... Take One Tablet By Mouth Once Daily. 6)  Cpap .... At Bedtime 7)  Lipitor 20 Mg Tabs (Atorvastatin Calcium) .... 1/2  By Mouth Once Daily 8)  Allergy Vaccine .... As Directed Per Dr. [name] 9)  Multivitamins   Tabs (Multiple Vitamin)  Allergies (verified): 1)  ! Zithromax 2)  !  Darvocet 3)  Zocor  Past History:  Past medical, surgical, family and social histories (including risk factors) reviewed, and no changes noted (except as noted below).  Past Medical History: Reviewed history from 12/29/2009 and no changes required. HYPERTENSION (ICD-401.9) HYPERLIPIDEMIA (ICD-272.4) LEUKOPENIA, CHRONIC (ICD-288.50) HYPERSOMNIA, ASSOCIATED WITH SLEEP APNEA (ICD-780.53)- on CPAP ASTHMA (ICD-V17.5) GERD (ICD-530.1) ALLERGIC RHINITIS (ICD-477.9) DIVERTICULITIS, HX OF (ICD-V12.79) ANXIETY (ICD-300.00) ANEMIA-NOS (ICD-285.9)with borderline low wbc and neg heme eval 10 years ago.    Syncope episode  2010  echo ok  Cards consult  Past Surgical History: Reviewed history from 09/24/2007 and no changes required. EGD  10/99, 07/00 echo 06/03  micro [facility] ECHO  2007 colonoscopy, diverticuli/1 perf  10/99 Hysterectomy, TAH  Past History:  Care Management: Allergy:  Pulmonary: [name] Gastroenterology: [name] Cardiology: [name]   Family History: Reviewed history from 07/04/2009 and no changes required. Asthma Rheumatoid Arthritis both parents had CAD Sisters had strokes   Social History: Reviewed history from 07/04/2009 and no  changes required. lives alone.  Lives in [place], alone. Divorced Never Smoked.  ETOH- none.  Regular exercise-yes walks        Review of Systems  The patient denies anorexia, fever, weight loss, weight gain, vision loss, decreased hearing, hoarseness, chest pain, syncope, dyspnea on exertion, and peripheral edema.         sleepy tired    slight edema stable  Physical Exam  General:  alert, well-developed, well-nourished, and well-hydrated.   Head:  normocephalic and atraumatic.   Eyes:  vision grossly intact and pupils equal.   Lungs:  Normal respiratory effort, chest expands symmetrically. Lungs are clear to auscultation, no crackles or wheezes. Heart:  normal rate, regular rhythm, no murmur, and no gallop.   Pulses:  pulses intact without delay   Extremities:  trace left pedal edema and trace right pedal edema.   Neurologic:  nonf ocal  Skin:  turgor normal and color normal.   Cervical Nodes:  No lymphadenopathy noted Psych:  not depressed appearing.   reviewed   bp log  from patient and check  her machine and outs   Impression & Recommendations:  Problem # 1:  SLEEPINESS (ICD-780.09) Assessment Deteriorated drowsiness   ? se of med    not taking low dose lexapro .  she was taking it as needed in the past   about once a week and this although could be heling anxiety   making her feel tired       .    doppler neck neg sig carotid disease  ct scan neg except empty sella  incidental finding   Problem # 2:  HYPERTENSION (ICD-401.9)  excellent readings at home but  up in the office and correleates with machine      possibility of even dc the med if continuing    but will monitor  Her updated medication list for this problem includes:    Cozaar 50 Mg Tabs (Losartan potassium) .Marland Kitchen... 1/3 to 1/2 tab once daily  Orders: Endocrinology Referral (Endocrine)  Problem # 3:  UNSPECIFIED DISORDER OF THYROID (ICD-246.9)  see [place]  from doppler       last   tsh was 10 months ago and  normal   refer for evaluation and  will prob need     labs etc.      see below.   Orders: Endocrinology Referral (Endocrine)  Problem # 4:  EMPTY  SELLA on CT scan  incidental finding but    get  endo to also comment   on need to evaluate for hypopit  etc  as cause of symptoms or need for further work up   Complete Medication List: 1)  Bayer Aspirin 325 Mg Tabs (Aspirin) .... Take 1 tablet by mouth once a day 2)  Cozaar 50 Mg Tabs (Losartan potassium) .... 1/3 to 1/2 tab once daily 3)  Lexapro 10 Mg Tabs (Escitalopram oxalate) .... Take 1/2 tablet by mouth every other day for a week and then stop or use as needed 4)  Prilosec Otc 20 Mg Tbec (Omeprazole magnesium) 5)  Vitamin D 400 Unit Caps (Cholecalciferol) .... Take one tablet by mouth once daily. 6)  Cpap  .... At bedtime 7)  Lipitor 20 Mg Tabs (Atorvastatin calcium) .... 1/2  by mouth once daily 8)  Allergy Vaccine  .... As directed per dr. Dennard Nip Belen 9)  Multivitamins Tabs (Multiple vitamin)  Hypertension Assessment/Plan:      The patient's hypertensive risk group is category B: At least one risk factor (excluding diabetes) with no target organ damage.  Her calculated 10 year risk of coronary heart disease is 11 %.  Today's blood pressure is 174/90.  Her blood pressure goal is < 145/90.  Patient Instructions: 1)  It is possible that the daily  lexapro is causing you to be sleepy  . 2)  Decrease lexapro  to taking every other day for a week and then stop or take as before.   3)  THis is a good medicine for anxiety  otherwise.  but  this may be causing drowsiness .  4)  Your  b;ood pressue at home is excellent and would continue the  1/3 of pill of the cozaar .  may  change pills in the meantime . 5)  Keep monitoring your blood pressure as you are doing . 6)  ROV in 1 month or as needed.  7)  the arteries in your neck ar not clogged  but your thyroid has some cyst s . Probably not related to your  sleepiness but weill get an  opinion from endocrinology specialist

## 2010-08-31 NOTE — Assessment & Plan Note (Signed)
Summary: follow up/ssc   Vital Signs:  Patient profile:   77 year old female Menstrual status:  hysterectomy Weight:      163 pounds Pulse rate:   66 / minute BP sitting:   174 / 90  (right arm) Cuff size:   regular  Vitals Entered By: [name], CMA (AAMA) (September 30, 2009 8:31 AM)  Serial Vital Signs/Assessments:  Time      Position  BP       Pulse  Resp  Temp     By                     160/80                         Madelin Headings MD  Comments: left arm sitting  reg cuff By: Madelin Headings MD   CC: Follow-up visit on BP- Pt states that her bp goes up and down, Hypertension Management   History of Present Illness: Cassandra English comesin  for follow up of her BP  and hx of syncope.  Since last visit  here  there have been no major changes in health status  . She has been doing well but BP UP and down however highest reading in 140s   lowest 110 range .    Was seen [facility] a local  [facility] [place] [facility]  for left ear pain   keflex 500 two times a day and naproxyn  still has mild soreness but better . No cp of sob.   NO new symptom using machine for   sleep apnea and due for follow up but lost the letter  to make the appt.   Hypertension History:      She complains of peripheral edema, but denies headache, chest pain, palpitations, dyspnea with exertion, orthopnea, PND, visual symptoms, neurologic problems, syncope, and side effects from treatment.  She notes no problems with any antihypertensive medication side effects.        Positive major cardiovascular risk factors include female age 37 years old or older, hyperlipidemia, and hypertension.  Negative major cardiovascular risk factors include non-tobacco-user status.     Preventive Screening-Counseling & Management  Alcohol-Tobacco     Alcohol drinks/day: 0     Smoking Status: never  Caffeine-Diet-Exercise     Caffeine use/day: Green Tea 1     Does Patient Exercise: yes     Type of exercise: Walking     Times/week:  4  Current Medications (verified): 1)  Bayer Aspirin 325 Mg Tabs (Aspirin) .... Take 1 Tablet By Mouth Once A Day 2)  Cozaar 100 Mg Tabs (Losartan Potassium) .... Take 1/2 Tablet By Mouth Once A Day 3)  Lexapro 10 Mg Tabs (Escitalopram Oxalate) .... Take 1/2 Tablet By Mouth Once A Day 4)  Prilosec Otc 20 Mg Tbec (Omeprazole Magnesium) 5)  Vitamin D 400 Unit  Caps (Cholecalciferol) .... Take One Tablet By Mouth Once Daily. 6)  Cpap .... At Bedtime 7)  Lipitor 20 Mg Tabs (Atorvastatin Calcium) .... 1/2  By Mouth Once Daily 8)  Allergy Vaccine .... As Directed Per Dr. [name] 9)  Multivitamins   Tabs (Multiple Vitamin)  Allergies (verified): 1)  ! Zithromax 2)  ! Darvocet 3)  Zocor  Past History:  Past medical, surgical, family and social histories (including risk factors) reviewed, and no changes noted (except as noted below).  Past Medical History: Reviewed history  from 07/04/2009 and no changes required. HYPERTENSION (ICD-401.9) HYPERLIPIDEMIA (ICD-272.4) LEUKOPENIA, CHRONIC (ICD-288.50) HYPERSOMNIA, ASSOCIATED WITH SLEEP APNEA (ICD-780.53)- on CPAP ASTHMA (ICD-V17.5) GERD (ICD-530.1) ALLERGIC RHINITIS (ICD-477.9) DIVERTICULITIS, HX OF (ICD-V12.79) ANXIETY (ICD-300.00) ANEMIA-NOS (ICD-285.9)with borderline low wbc and neg heme eval 10 years ago.    Syncope episode  2010  echo ok   Past Surgical History: Reviewed history from 09/24/2007 and no changes required. EGD  10/99, 07/00 echo 06/03  micro [facility] ECHO  2007 colonoscopy, diverticuli/1 perf  10/99 Hysterectomy, TAH  Family History: Reviewed history from 07/04/2009 and no changes required. Asthma Rheumatoid Arthritis both parents had CAD Sisters had strokes   Social History: Reviewed history from 07/04/2009 and no changes required. lives alone.  Lives in [place], alone. Divorced Never Smoked.  ETOH- none.  Regular exercise-yes walks        Review of Systems  The patient denies anorexia, fever,  weight loss, weight gain, vision loss, decreased hearing, hoarseness, syncope, dyspnea on exertion, peripheral edema, prolonged cough, abdominal pain, transient blindness, difficulty walking, abnormal bleeding, and enlarged lymph nodes.    Physical Exam  General:  Well-developed,well-nourished,in no acute distress; alert,appropriate and cooperative throughout examination Head:  normocephalic and atraumatic.   Lungs:  Normal respiratory effort, chest expands symmetrically. Lungs are clear to auscultation, no crackles or wheezes. Heart:  Normal rate and regular rhythm. S1 and S2 normal without gallop, murmur, click, rub or other extra sounds. Msk:  normal ROM, no joint warmth, and no redness over joints.   Pulses:  pulses intact without delay   Extremities:  no clubbing cyanosis or edema  Neurologic:  non focal  normal gait  Cervical Nodes:  No lymphadenopathy noted Psych:  Oriented X3, normally interactive, good eye contact, not anxious appearing, and not depressed appearing.     Impression & Recommendations:  Problem # 1:  HYPERTENSION (ICD-401.9) up in office today but 120-140 at home with an ocass lower. because of her hx of orthostasis and syncope that has not recurred  will document home readings machine accuracy and follow instead of intensifying regimen.  The following medications were removed from the medication list:    Norvasc 5 Mg Tabs (Amlodipine besylate) .Marland Kitchen... 1/2 to 1 by mouth once daily Her updated medication list for this problem includes:    Cozaar 100 Mg Tabs (Losartan potassium) .Marland Kitchen... Take 1/2 tablet by mouth once a day  Problem # 2:  SYNCOPE (ICD-780.2) Assessment: Improved no recurrence  ok to drive if continuing  Problem # 3:  HYPERSOMNIA, ASSOCIATED WITH SLEEP APNEA (ICD-780.53) using machine with help  Problem # 4:  HYPERLIPIDEMIA (ICD-272.4) samples given  cost a factor   Her updated medication list for this problem includes:    Lipitor 20 Mg Tabs  (Atorvastatin calcium) .Marland Kitchen... 1/2  by mouth once daily  Complete Medication List: 1)  Bayer Aspirin 325 Mg Tabs (Aspirin) .... Take 1 tablet by mouth once a day 2)  Cozaar 100 Mg Tabs (Losartan potassium) .... Take 1/2 tablet by mouth once a day 3)  Lexapro 10 Mg Tabs (Escitalopram oxalate) .... Take 1/2 tablet by mouth once a day 4)  Prilosec Otc 20 Mg Tbec (Omeprazole magnesium) 5)  Vitamin D 400 Unit Caps (Cholecalciferol) .... Take one tablet by mouth once daily. 6)  Cpap  .... At bedtime 7)  Lipitor 20 Mg Tabs (Atorvastatin calcium) .... 1/2  by mouth once daily 8)  Allergy Vaccine  .... As directed per dr. Dennard Nip Ely 9)  Multivitamins Tabs (Multiple  vitamin)  Hypertension Assessment/Plan:      The patient's hypertensive risk group is category B: At least one risk factor (excluding diabetes) with no target organ damage.  Her calculated 10 year risk of coronary heart disease is 11 %.  Today's blood pressure is 174/90.  Her blood pressure goal is < 145/90.  Patient Instructions: 1)  Dr [name] is the pulmonary doctor that you should make a follow up appt with this.  ph [phone] 2)  Nurse visit  with shannon and bring in your machine  to see if accurate. 3)  Check your BP readings at least 3 x per week.  Call if 160 or over . 4)  If this is ok 5)  return office visit in 4 months and  get BMP , tsh   , CBC before  visit .

## 2010-08-31 NOTE — Assessment & Plan Note (Signed)
Summary: bp elevated/njr   Vital Signs:  Patient profile:   77 year old female Menstrual status:  hysterectomy Weight:      163 pounds Pulse rate:   72 / minute Pulse (ortho):   60 / minute BP sitting:   128 / 80  (left arm) BP standing:   130 / 70 Cuff size:   regular  Vitals Entered By: [name], CMA (AAMA) (December 29, 2009 10:05 AM)  Serial Vital Signs/Assessments:  Time      Position  BP       Pulse  Resp  Temp     By           Sitting   140/75   60                    Madelin Headings MD           Standing  130/70   60                    Madelin Headings MD  CC: No energy and dizziness   History of Present Illness: Cassandra English walks in today for above.   She feels tired and dizzy at times and  this week had had 2 migraine this week.  described as a severe HA frontal that relieved with rest . No med taken except her asa.   C/O of  Bp being low with HA   in the 117 range  and pulse ok.   NO syncope or sieszure . Tired and no energy.    was hard to think to talk on the phone . was able to walk and no pain.  NO new meds and not out in the heat . Using her sleep apnea equipment ok.   No edema cp sob  or vision changes or hearing changes.   Preventive Screening-Counseling & Management  Alcohol-Tobacco     Alcohol drinks/day: 0     Smoking Status: never  Caffeine-Diet-Exercise     Caffeine use/day: Green Tea 1     Does Patient Exercise: yes     Type of exercise: Walking     Times/week: 4  Current Medications (verified): 1)  Bayer Aspirin 325 Mg Tabs (Aspirin) .... Take 1 Tablet By Mouth Once A Day 2)  Cozaar 100 Mg Tabs (Losartan Potassium) .... Take 1/2 Tablet By Mouth Once A Day 3)  Lexapro 10 Mg Tabs (Escitalopram Oxalate) .... Take 1/2 Tablet By Mouth Once A Day 4)  Prilosec Otc 20 Mg Tbec (Omeprazole Magnesium) 5)  Vitamin D 400 Unit  Caps (Cholecalciferol) .... Take One Tablet By Mouth Once Daily. 6)  Cpap .... At Bedtime 7)  Lipitor 20 Mg Tabs  (Atorvastatin Calcium) .... 1/2  By Mouth Once Daily 8)  Allergy Vaccine .... As Directed Per Dr. [name] 9)  Multivitamins   Tabs (Multiple Vitamin)  Allergies (verified): 1)  ! Zithromax 2)  ! Darvocet 3)  Zocor  Past History:  Past medical, surgical, family and social histories (including risk factors) reviewed, and no changes noted (except as noted below).  Past Medical History: HYPERTENSION (ICD-401.9) HYPERLIPIDEMIA (ICD-272.4) LEUKOPENIA, CHRONIC (ICD-288.50) HYPERSOMNIA, ASSOCIATED WITH SLEEP APNEA (ICD-780.53)- on CPAP ASTHMA (ICD-V17.5) GERD (ICD-530.1) ALLERGIC RHINITIS (ICD-477.9) DIVERTICULITIS, HX OF (ICD-V12.79) ANXIETY (ICD-300.00) ANEMIA-NOS (ICD-285.9)with borderline low wbc and neg heme eval 10 years ago.    Syncope episode  2010  echo ok  Cards consult  Past Surgical History:  Reviewed history from 09/24/2007 and no changes required. EGD  10/99, 07/00 echo 06/03  micro [facility] ECHO  2007 colonoscopy, diverticuli/1 perf  10/99 Hysterectomy, TAH  Past History:  Care Management: Allergy: Blanca Pulmonary: [name] Gastroenterology: [name] Cardiology: [name]   Family History: Reviewed history from 07/04/2009 and no changes required. Asthma Rheumatoid Arthritis both parents had CAD Sisters had strokes   Social History: Reviewed history from 07/04/2009 and no changes required. lives alone.  Lives in [place], alone. Divorced Never Smoked.  ETOH- none.  Regular exercise-yes walks        Review of Systems  The patient denies anorexia, fever, weight loss, weight gain, vision loss, decreased hearing, hoarseness, chest pain, dyspnea on exertion, peripheral edema, prolonged cough, abdominal pain, melena, hematochezia, hematuria, transient blindness, difficulty walking, unusual weight change, abnormal bleeding, enlarged lymph nodes, and angioedema.    Physical Exam  General:  Well-developed,well-nourished,in no acute distress; alert,appropriate  and cooperative throughout examination Head:  normocephalic and atraumatic.   Eyes:  PERRL, EOMs full, conjunctiva clear  Ears:  R ear normal, L ear normal, and no external deformities.   Nose:  no external deformity and no external erythema.   Mouth:  tongue midline face symmertrical Neck:  No deformities, masses, or tenderness noted. Lungs:  Normal respiratory effort, chest expands symmetrically. Lungs are clear to auscultation, no crackles or wheezes. Heart:  normal rate, regular rhythm, , and no gallop.  see BP readings  short sem lusb no radiation  Abdomen:  Bowel sounds positive,abdomen soft and non-tender without masses, organomegaly or  noted. Pulses:  pulses intact without delay   Extremities:  no clubbing cyanosis or edema  Neurologic:  alert & oriented X3, strength normal in all extremities, and gait normal.   Skin:  turgor normal, color normal, no ecchymoses, and no petechiae.   Cervical Nodes:  No lymphadenopathy noted Psych:  Oriented X3, good eye contact, not anxious appearing, and not depressed appearing.   EKG nsr nonsp twave  flattening in  anteriorlat      slight ly different from last ekg otherwise  no acute findings  rate 57  nl intervals.  Impression & Recommendations:  Problem # 1:  HEADACHE (ICD-784.0) Assessment New  remote hx of migraines  but none for years  non focal neuro exam      plan ct scan today and if ok follow up  Her updated medication list for this problem includes:    Bayer Aspirin 325 Mg Tabs (Aspirin) .Marland Kitchen... Take 1 tablet by mouth once a day  Orders: TLB-BMP (Basic Metabolic Panel-BMET) (80048-METABOL) TLB-CBC Platelet - w/Differential (85025-CBCD) Radiology Referral (Radiology)  Problem # 2:  DIZZINESS (ICD-780.4)  pt describes low bp 117 range with ha  but ok her with slight ortho static changes  .  ? if could be any med effect doesn not appear dehydrated  today.   will check  labs and ct  ekg show some minor twave changes that may be  different than last  check of unclear significance.    Orders: TLB-BMP (Basic Metabolic Panel-BMET) (80048-METABOL) TLB-CBC Platelet - w/Differential (85025-CBCD) Radiology Referral (Radiology) EKG w/ Interpretation (93000)  Problem # 3:  HYPERTENSION (ICD-401.9)  Her updated medication list for this problem includes:    Cozaar 100 Mg Tabs (Losartan potassium) .Marland Kitchen... Take 1/2 tablet by mouth once a day  Orders: TLB-BMP (Basic Metabolic Panel-BMET) (80048-METABOL) TLB-CBC Platelet - w/Differential (85025-CBCD) EKG w/ Interpretation (93000)  BP today: 128/80 Prior BP: 160/86 (10/25/2009)  Prior  10 Yr Risk Heart Disease: 11 % (09/30/2009)  Labs Reviewed: K+: 4.2 (03/24/2009) Creat: : 1.0 (03/24/2009)   Chol: 182 (07/04/2009)   HDL: 63.30 (07/04/2009)   LDL: 101 (07/04/2009)   TG: 91.0 (07/04/2009)  Problem # 4:  HYPERSOMNIA, ASSOCIATED WITH SLEEP APNEA (ICD-780.53) under treatment  Problem # 5:  ANEMIA-NOS (ICD-285.9) check today.   Complete Medication List: 1)  Bayer Aspirin 325 Mg Tabs (Aspirin) .... Take 1 tablet by mouth once a day 2)  Cozaar 100 Mg Tabs (Losartan potassium) .... Take 1/2 tablet by mouth once a day 3)  Lexapro 10 Mg Tabs (Escitalopram oxalate) .... Take 1/2 tablet by mouth once a day 4)  Prilosec Otc 20 Mg Tbec (Omeprazole magnesium) 5)  Vitamin D 400 Unit Caps (Cholecalciferol) .... Take one tablet by mouth once daily. 6)  Cpap  .... At bedtime 7)  Lipitor 20 Mg Tabs (Atorvastatin calcium) .... 1/2  by mouth once daily 8)  Allergy Vaccine  .... As directed per dr. Dennard Nip Flora 9)  Multivitamins Tabs (Multiple vitamin)  Patient Instructions: 1)  your blood pressure is ok in office and will let you know the result s of your tests and then plan follow up.

## 2010-08-31 NOTE — Progress Notes (Signed)
Summary: bp   Phone Note Call from Patient Call back at [facility] Phone [phone]   Caller: Patient Summary of Call: Pt called stating that her bp on 6/12 131/84,6/13- 135/86, and 6/14- 124/81. Initial call taken by: [name], CMA Duncan Dull),  January 11, 2010 4:11 PM  Follow-up for Phone Call        theses are good continue to monitor Follow-up by: Madelin Headings MD,  January 18, 2010 8:37 PM

## 2010-08-31 NOTE — Assessment & Plan Note (Signed)
Summary: NEW ENDO CON/ CYST ON THYROID/ [facility]  #   Vital Signs:  Patient profile:   77 year old female Menstrual status:  hysterectomy Height:      63 inches (160.02 cm) Weight:      164.75 pounds (74.89 kg) BMI:     29.29 O2 Sat:      98 % on Room air Temp:     98.2 degrees F (36.78 degrees C) oral Pulse rate:   52 / minute BP sitting:   138 / 82  (left arm) Cuff size:   regular  Vitals Entered By: Brenton Grills MA (February 09, 2010 1:25 PM)  O2 Flow:  Room air CC: New Endo Pt/Cyst on Thyroid/aj   Primary Provider:  panosh  CC:  New Endo Pt/Cyst on Thyroid/aj.  History of Present Illness: pt was incidentally noted on carotid doppler to have a small multinodular goiter.   pt states she feels well in general, except she has many years of slight weakness in the muscles, and associated fatigue.  she does not notice the goiter.  Current Medications (verified): 1)  Bayer Aspirin 325 Mg Tabs (Aspirin) .... Take 1 Tablet By Mouth Once A Day 2)  Cozaar 50 Mg Tabs (Losartan Potassium) .... 1/3 To 1/2 Tab Once Daily 3)  Lexapro 10 Mg Tabs (Escitalopram Oxalate) .... Take 1/2 Tablet By Mouth Every Other Day For A Week and Then Stop or Use As Needed 4)  Prilosec Otc 20 Mg Tbec (Omeprazole Magnesium) 5)  Vitamin D 400 Unit  Caps (Cholecalciferol) .... Take One Tablet By Mouth Once Daily. 6)  Cpap .... At Bedtime 7)  Lipitor 20 Mg Tabs (Atorvastatin Calcium) .... 1/2  By Mouth Once Daily 8)  Allergy Vaccine .... As Directed Per Dr. [name] 9)  Multivitamins   Tabs (Multiple Vitamin)  Allergies (verified): 1)  ! Zithromax 2)  ! Darvocet 3)  Zocor  Past History:  Past Medical History: Last updated: 12/29/2009 HYPERTENSION (ICD-401.9) HYPERLIPIDEMIA (ICD-272.4) LEUKOPENIA, CHRONIC (ICD-288.50) HYPERSOMNIA, ASSOCIATED WITH SLEEP APNEA (ICD-780.53)- on CPAP ASTHMA (ICD-V17.5) GERD (ICD-530.1) ALLERGIC RHINITIS (ICD-477.9) DIVERTICULITIS, HX OF (ICD-V12.79) ANXIETY  (ICD-300.00) ANEMIA-NOS (ICD-285.9)with borderline low wbc and neg heme eval 10 years ago.    Syncope episode  2010  echo ok  Cards consult  Family History: Reviewed history from 07/04/2009 and no changes required. Asthma Rheumatoid Arthritis both parents had CAD Sisters had strokes  no goiter or other thyroid problem  Social History: Reviewed history from 07/04/2009 and no changes required. lives alone.  Lives in [place], alone. Divorced Never Smoked.  ETOH- none.  Regular exercise-yes walks        Review of Systems       The patient complains of headaches and depression.         denies hair loss, cramps, sob, weight gain, memory loss, constipation, numbness, myalgias, dry skin.  she attributes visual loss to cataracts.  no syncope since 2010.  her dysphagia is due to h/o esophageal stricture.      Physical Exam  General:  normal appearance.   Head:  head: no deformity eyes: no periorbital swelling, no proptosis external nose and ears are normal mouth: no lesion seen Neck:  i do not appreciate the thyroid abnormality noted on ultrasound. Lungs:  Clear to auscultation bilaterally. Normal respiratory effort.  Heart:  Regular rate and rhythm without murmurs or gallops noted. Normal S1,S2.   Msk:  muscle bulk and strength are grossly normal.  no obvious joint swelling.  gait is normal and steady  Pulses:  dorsalis pedis intact bilat.  Extremities:  no deformity no edema Neurologic:  cn 2-12 grossly intact.   readily moves all 4's.   sensation is intact to touch on the feet  Skin:  normal texture and temp.  no rash.  not diaphoretic  Cervical Nodes:  No significant adenopathy.  Psych:  Alert and cooperative; normal mood and affect; normal attention span and concentration.   Additional Exam:   FastTSH                   1.75 uIU/mL      i reviewed carotid doppler study   Impression & Recommendations:  Problem # 1:  GOITER, MULTINODULAR (ICD-241.1) very small,  and   Problem # 2:  muscle weakness not thyroid-related  Problem # 3:  dysphagia not thyroid-related  Other Orders: TLB-TSH (Thyroid Stimulating Hormone) (84443-TSH) Est. Patient Level IV ([address]109)  Patient Instructions: 1)  most of the time, a "lumpy thyroid" will eventually become overactive.  this is usually a slow process, happening over the span of many years. 2)  blood tests are being ordered for you today.  please call [phone] to hear your test results.  if the blood test is normal, you should conclude that your tiredness is not related to the thyroid. 3)  you should have a thyroid ultrasound in 1 year. 4)  each year, for the rest of your life, you should have the doctor examine your neck, and check a thyroid blood test.  i would be happy to do this, or dr Fabian Sharp could do so. 5)  return here as needed.

## 2010-08-31 NOTE — Progress Notes (Signed)
Summary: bp issues and dizziness  Phone Note Call from Patient Call back at Home Phone [phone]   Caller: Patient Summary of Call: Pt called saying that she is dizzy. Pt is taking lexapro daily 1/4 tab. bp 114/75 today. Pt states that she doesn't feel quit as bad when 1/4 tab of the lexapro. Pt is feeling tired and weakness. Pt states that she sleeps all the time.  Initial call taken by: [name], CMA (AAMA),  January 05, 2010 11:59 AM  Follow-up for Phone Call        Per Dr. Fabian Sharp- Keep doing the same medication and call next week if bp goes to 100 call earlier and we can adjust her medication. Follow-up by: [name], CMA (AAMA),  January 05, 2010 12:07 PM

## 2010-08-31 NOTE — Progress Notes (Signed)
Summary: BP med cutting into 1/3  Phone Note Call from Patient Call back at Home Phone [phone]   Caller: Patient Summary of Call: Pt called wanting to let us know that she cut the cozaar 100mg  and is now taking 1/3 tab.  Bp is 124/82 on today and 113/81 on 6/5. Pt started taking 1/3 of tab on 6/5. Pt states it was 111/81 on top on 6/4  Initial call taken by: [name], CMA Duncan Dull),  January 02, 2010 12:36 PM  Follow-up for Phone Call        [facility]  to take  lower dose of cozaar    we can call in 50 mg tabs and she can take 1/2 of that per day. Disp 30   refill x 1    take daily as directed  Follow-up by: Madelin Headings MD,  January 02, 2010 1:23 PM  Additional Follow-up for Phone Call Additional follow up Details #1::        Pt aware of this and will call back with bp in a few weeks. Additional Follow-up by: [name], CMA (AAMA),  January 02, 2010 3:44 PM    New/Updated Medications: COZAAR 50 MG TABS (LOSARTAN POTASSIUM) 1/2 tab once daily Prescriptions: COZAAR 50 MG TABS (LOSARTAN POTASSIUM) 1/2 tab once daily  #30 x 1   Entered by:   [name], CMA (AAMA)   Authorized by:   Madelin Headings MD   Signed by:   [name], CMA (AAMA) on 01/02/2010   Method used:   Electronically to        [facility]  [phone]* (retail)       [address]       [place], [place]  [address]       Ph: [phone] or [phone]       Fax: [phone]   RxID:   [id number]

## 2010-08-31 NOTE — Assessment & Plan Note (Signed)
Summary: rov ///kp   Visit Type:  Follow-up Primary Provider/Referring Provider:  panosh  CC:  Pt here for follow up. Pt states is using CPAP machine every night. .  History of Present Illness: 77/F for FU of  mild obstructive sleep apnea  -improved with CPAP. PSG >> AHI 10.9, predominant hypopneas, REM 17/h CPAP download 08/04/07- 2/13 >> good compliance, avg pressure 10 cm on auto 5-15  5/10 Agrees that CPAP benefits incl. less sleepinesss, more energy. reviewed download 1/5 to 09/12/07 , 65% COMPLIANCE, cpap PR + 9.8  October 25, 2009 11:36 AM  Had a spell in 9/10  of high BP/ dizziness, stopped driving, asked to drink a lot of water & wear support hose, wakes up with nocturia so frequently that CPAP use has been difficult.  bedtime 10p , BR visit 1 am --> sometimes leaves CPAP off.  c/o  allergies that make it difficult to use CPAP - Dr Corinda Gubler gave her some meds. c/o mask indentation on face. When she uses it, she 'sleeps real good' & wakes up refreshed. Would like to keep using it.  Current Medications (verified): 1)  Bayer Aspirin 325 Mg Tabs (Aspirin) .... Take 1 Tablet By Mouth Once A Day 2)  Cozaar 100 Mg Tabs (Losartan Potassium) .... Take 1/2 Tablet By Mouth Once A Day 3)  Lexapro 10 Mg Tabs (Escitalopram Oxalate) .... Take 1/2 Tablet By Mouth Once A Day 4)  Prilosec Otc 20 Mg Tbec (Omeprazole Magnesium) 5)  Vitamin D 400 Unit  Caps (Cholecalciferol) .... Take One Tablet By Mouth Once Daily. 6)  Cpap .... At Bedtime 7)  Lipitor 20 Mg Tabs (Atorvastatin Calcium) .... 1/2  By Mouth Once Daily 8)  Allergy Vaccine .... As Directed Per Dr. [name] 9)  Multivitamins   Tabs (Multiple Vitamin)  Allergies (verified): 1)  ! Zithromax 2)  ! Darvocet 3)  Zocor  Past History:  Past Medical History: Last updated: 07/04/2009 HYPERTENSION (ICD-401.9) HYPERLIPIDEMIA (ICD-272.4) LEUKOPENIA, CHRONIC (ICD-288.50) HYPERSOMNIA, ASSOCIATED WITH SLEEP APNEA (ICD-780.53)- on  CPAP ASTHMA (ICD-V17.5) GERD (ICD-530.1) ALLERGIC RHINITIS (ICD-477.9) DIVERTICULITIS, HX OF (ICD-V12.79) ANXIETY (ICD-300.00) ANEMIA-NOS (ICD-285.9)with borderline low wbc and neg heme eval 10 years ago.    Syncope episode  2010  echo ok   Social History: Last updated: 07/04/2009 lives alone.  Lives in [place], alone. Divorced Never Smoked.  ETOH- none.  Regular exercise-yes walks        Review of Systems  The patient denies anorexia, fever, weight loss, weight gain, vision loss, decreased hearing, hoarseness, chest pain, syncope, dyspnea on exertion, peripheral edema, prolonged cough, headaches, hemoptysis, abdominal pain, melena, hematochezia, severe indigestion/heartburn, hematuria, muscle weakness, difficulty walking, depression, unusual weight change, and abnormal bleeding.    Vital Signs:  Patient profile:   77 year old female Menstrual status:  hysterectomy Height:      63 inches Weight:      168.25 pounds O2 Sat:      95 % on Room air Temp:     97.9 degrees F oral Pulse rate:   75 / minute BP sitting:   160 / 86  (left arm) Cuff size:   regular  Vitals Entered By: Zackery Barefoot CMA (October 25, 2009 11:06 AM)  O2 Flow:  Room air CC: Pt here for follow up. Pt states is using CPAP machine every night.  Comments Medications reviewed with patient Verified contact number and pharmacy with patient Zackery Barefoot CMA  October 25, 2009 11:07 AM    Physical  Exam  Additional Exam:  Gen. Pleasant, well-nourished, in no distress ENT - no lesions, no post nasal drip Neck: No JVD, no thyromegaly, no carotid bruits Lungs: no use of accessory muscles, no dullness to percussion, clear without rales or rhonchi  Cardiovascular: Rhythm regular, heart sounds  normal, no murmurs or gallops, no peripheral edema Musculoskeletal: No deformities, no cyanosis or clubbing      Impression & Recommendations:  Problem # 1:  HYPERSOMNIA, ASSOCIATED WITH SLEEP APNEA  (ICD-780.53) Compliance encouraged, wt loss emphasized, asked to avoid meds with sedative side effects, cautioned against driving when sleepy.  Given subjective improvement with CPAP, will persist with this. Orders: Est. Patient Level III (16109) Est. Patient Level III (60454)  Patient Instructions: 1)  Please schedule a follow-up appointment in 1 year. 2)  Call [facility] if you need supplies 3)  make sure you change the filters every 6 months   Immunization History:  Influenza Immunization History:    Influenza:  historical (05/02/2009)

## 2010-08-31 NOTE — Assessment & Plan Note (Signed)
Summary: 1 month rov/njr   Vital Signs:  Patient profile:   77 year old female Menstrual status:  hysterectomy Weight:      165 pounds Pulse rate:   78 / minute BP sitting:   120 / 80  (left arm) Cuff size:   regular  Vitals Entered By: [name], CMA (AAMA) (February 23, 2010 9:59 AM)  Serial Vital Signs/Assessments:  Time      Position  BP       Pulse  Resp  Temp     By                     120/80                         Madelin Headings MD  Comments: left arm sitting By: Madelin Headings MD   CC: follow-up visit, Hypertension Management   History of Present Illness: Cassandra English comes in today  for  fu of her blood pressure readings and  fatigue. Since last visit she has seen Dr Everardo All about her thyroid and dx multinodular goiter that is currently euthyroid and not causing her fatigue.  She is only taking lexapro ocass and not every day and using cpap regularly. Still is very tired and not doing much.  No extra walking with 90-100 weather spell outsie. BP readings have been in the 120-130 range and nl  only on low  at 108 .   taking only 1/3 of a cozaar 50 .  Hypertension History:      She complains of peripheral edema, syncope, and side effects from treatment, but denies headache, chest pain, palpitations, dyspnea with exertion, orthopnea, PND, visual symptoms, and neurologic problems.  She notes the following problems with antihypertensive medication side effects: Dizziness.        Positive major cardiovascular risk factors include female age 29 years old or older, hyperlipidemia, and hypertension.  Negative major cardiovascular risk factors include non-tobacco-user status.     Preventive Screening-Counseling & Management  Alcohol-Tobacco     Alcohol drinks/day: 0     Smoking Status: never  Caffeine-Diet-Exercise     Caffeine use/day: Green Tea 1     Does Patient Exercise: yes     Type of exercise: Walking     Times/week: 4  Current Medications  (verified): 1)  Bayer Aspirin 325 Mg Tabs (Aspirin) .... Take 1 Tablet By Mouth Once A Day 2)  Cozaar 50 Mg Tabs (Losartan Potassium) .... 1/3 To 1/2 Tab Once Daily 3)  Lexapro 10 Mg Tabs (Escitalopram Oxalate) .... Take 1/2 Tablet By Mouth Every Other Day For A Week and Then Stop or Use As Needed 4)  Prilosec Otc 20 Mg Tbec (Omeprazole Magnesium) 5)  Vitamin D 400 Unit  Caps (Cholecalciferol) .... Take One Tablet By Mouth Once Daily. 6)  Cpap .... At Bedtime 7)  Lipitor 20 Mg Tabs (Atorvastatin Calcium) .... 1/2  By Mouth Once Daily 8)  Allergy Vaccine .... As Directed Per Dr. [name] 9)  Multivitamins   Tabs (Multiple Vitamin)  Allergies (verified): 1)  ! Zithromax 2)  ! Darvocet 3)  Zocor  Past History:  Past medical, surgical, family and social histories (including risk factors) reviewed, and no changes noted (except as noted below).  Past Medical History: HYPERTENSION (ICD-401.9) HYPERLIPIDEMIA (ICD-272.4) LEUKOPENIA, CHRONIC (ICD-288.50) HYPERSOMNIA, ASSOCIATED WITH SLEEP APNEA (ICD-780.53)- on CPAP ASTHMA (ICD-V17.5) GERD (ICD-530.1) ALLERGIC RHINITIS (ICD-477.9) DIVERTICULITIS, HX OF (  ICD-V12.79) ANXIETY (ICD-300.00) ANEMIA-NOS (ICD-285.9)with borderline low wbc and neg heme eval 10 years ago.    Syncope episode  2010  echo ok  Cards consult Multinodular goiter   2011  get yearly [place]  Past Surgical History: Reviewed history from 09/24/2007 and no changes required. EGD  10/99, 07/00 echo 06/03  micro [facility] ECHO  2007 colonoscopy, diverticuli/1 perf  10/99 Hysterectomy, TAH  Past History:  Care Management: Allergy: [name]: [name] Gastroenterology: [name] Cardiology: [name]  Endocrinology: [facility]  Family History: Reviewed history from 02/09/2010 and no changes required. Asthma Rheumatoid Arthritis both parents had CAD Sisters had strokes  no goiter or other thyroid problem  Social History: Reviewed history from 07/04/2009 and no changes  required. lives alone.  Lives in [place], alone. Divorced Never Smoked.  ETOH- none.  Regular exercise-yes walks        Review of Systems  The patient denies anorexia, fever, weight loss, weight gain, vision loss, prolonged cough, abdominal pain, difficulty walking, abnormal bleeding, enlarged lymph nodes, and angioedema.    Physical Exam  General:  alert, well-developed, well-nourished, and well-hydrated.   Head:  normocephalic and atraumatic.   Eyes:  vision grossly intact, pupils equal, and pupils round.   Lungs:  Normal respiratory effort, chest expands symmetrically. Lungs are clear to auscultation, no crackles or wheezes. Heart:  Normal rate and regular rhythm. S1 and S2 normal without gallop, murmur, click, rub or other extra sounds. Pulses:  pulses intact without delay   Extremities:  no clubbing cyanosis  slight edema Neurologic:  non focalalert & oriented X3.   Skin:  turgor normal, color normal, no ecchymoses, and no petechiae.   Cervical Nodes:  No lymphadenopathy noted Psych:  Oriented X3, normally interactive, good eye contact, not anxious appearing, and not depressed appearing.     Impression & Recommendations:  Problem # 1:  HYPERTENSION (ICD-401.9)  improved  on very low dose of med and disc getting 25 and cutting in half    consider dc but she has had sign elevation in the past.   will follow  and avoid hypotension Her updated medication list for this problem includes:    Cozaar 50 Mg Tabs (Losartan potassium) .Marland Kitchen... 1/3  of  tab once daily  BP today: 120/80 Prior BP: 138/82 (02/09/2010)  10 Yr Risk Heart Disease: 7 % Prior 10 Yr Risk Heart Disease: 11 % (09/30/2009)  Labs Reviewed: K+: 3.8 (12/29/2009) Creat: : 1.1 (12/29/2009)   Chol: 182 (07/04/2009)   HDL: 63.30 (07/04/2009)   LDL: 101 (07/04/2009)   TG: 91.0 (07/04/2009)  Problem # 2:  FATIGUE (ICD-780.79) unclear     bp dosent seem too low      osa rx etc and no anemia and thyroid ok      heat  environs could be adding     will monitor     Problem # 3:  GOITER, MULTINODULAR (ICD-241.1) reported   tsh results.    Complete Medication List: 1)  Bayer Aspirin 325 Mg Tabs (Aspirin) .... Take 1 tablet by mouth once a day 2)  Cozaar 50 Mg Tabs (Losartan potassium) .... 1/3  of  tab once daily 3)  Lexapro 10 Mg Tabs (Escitalopram oxalate) .... Take 1/2 tablet by mouth every other day for a week and then stop or use as needed 4)  Prilosec Otc 20 Mg Tbec (Omeprazole magnesium) 5)  Vitamin D 400 Unit Caps (Cholecalciferol) .... Take one tablet by mouth once daily. 6)  Cpap  .Marland KitchenMarland KitchenMarland Kitchen  At bedtime 7)  Lipitor 20 Mg Tabs (Atorvastatin calcium) .... 1/2  by mouth once daily 8)  Allergy Vaccine  .... As directed per dr. Dennard Nip Talihina 9)  Multivitamins Tabs (Multiple vitamin)  Hypertension Assessment/Plan:      The patient's hypertensive risk group is category B: At least one risk factor (excluding diabetes) with no target organ damage.  Her calculated 10 year risk of coronary heart disease is 7 %.  Today's blood pressure is 120/80.  Her blood pressure goal is < 145/90.  Patient Instructions: 1)  stay on the low dose   cozaar. if you blood pressure.  2)  Your blood pressure is good today. 3)  If Bp     is below 110 on a regular basis call   for advice .  4)  return office visit in 2-3 months .

## 2010-08-31 NOTE — Miscellaneous (Signed)
Summary: Orders Update  Clinical Lists Changes  Orders: Added new Test order of Carotid Duplex (Carotid Duplex) - Signed 

## 2010-09-18 ENCOUNTER — Ambulatory Visit: Payer: Self-pay | Admitting: Pulmonary Disease

## 2010-09-25 DIAGNOSIS — Z9109 Other allergy status, other than to drugs and biological substances: Secondary | ICD-10-CM | POA: Insufficient documentation

## 2010-09-28 ENCOUNTER — Ambulatory Visit (INDEPENDENT_AMBULATORY_CARE_PROVIDER_SITE_OTHER): Payer: Medicare Other | Admitting: Pulmonary Disease

## 2010-09-28 ENCOUNTER — Encounter: Payer: Self-pay | Admitting: Pulmonary Disease

## 2010-09-28 DIAGNOSIS — G4730 Sleep apnea, unspecified: Secondary | ICD-10-CM

## 2010-10-05 NOTE — Assessment & Plan Note (Signed)
Summary: f/u ///kp   Visit Type:  Follow-up Primary Provider/Referring Provider:  Madelin Headings MD  CC:  Pt here for follow up on Hypersomnia. Pt states has not been able to use machine because mask does not fit. Pt wants a different DME company having problems getting supplies from [facility].  History of Present Illness: 83/F for FU of  mild obstructive sleep apnea  -improved with CPAP. PSG >> AHI 10.9, predominant hypopneas, REM 17/h CPAP download 08/04/07- 2/13 >> good compliance, avg pressure 10 cm on auto 5-15  5/10 Agrees that CPAP benefits incl. less sleepinesss, more energy. reviewed download 1/5 to 09/12/07 , 65% COMPLIANCE, cpap PR + 9.8  October 25, 2009 11:36 AM  Had a spell in 9/10  of high BP/ dizziness, stopped driving, asked to drink a lot of water & wear support hose, wakes up with nocturia so frequently that CPAP use has been difficult.  bedtime 10p , BR visit 1 am --> sometimes leaves CPAP off.  c/o  allergies that make it difficult to use CPAP - Dr Corinda Gubler gave her some meds. c/o mask indentation on face. When she uses it, she 'sleeps real good' & wakes up refreshed. Would like to keep using it.  September 28, 2010 2:37 PM  Stopped using CPAP after eye surgery, also had issues with DME ([facility]  - wrong mask -had to return , Is agreeable to resume using  Preventive Screening-Counseling & Management  Alcohol-Tobacco     Alcohol drinks/day: 0     Smoking Status: never  Current Medications (verified): 1)  Bayer Aspirin 325 Mg Tabs (Aspirin) .... Take 1 Tablet By Mouth Once A Day 2)  Cozaar 50 Mg Tabs (Losartan Potassium) .... 1/2  of  Tab Once Daily 3)  Lexapro 10 Mg Tabs (Escitalopram Oxalate) .... Take 1/2 Tablet By Mouth Every Other Day For A Week and Then Stop or Use As Needed 4)  Prilosec Otc 20 Mg Tbec (Omeprazole Magnesium) .... Take 1 Tablet By Mouth Once A Day 5)  Vitamin D 400 Unit  Caps (Cholecalciferol) .... Take One Tablet By Mouth Once  Daily. 6)  Cpap .... At Bedtime 7)  Lipitor 20 Mg Tabs (Atorvastatin Calcium) .... 1/2  By Mouth Once Daily 8)  Allergy Vaccine .... As Directed Per Dr. [name] 9)  Multivitamins   Tabs (Multiple Vitamin)  Allergies (verified): 1)  ! Zithromax 2)  ! Darvocet 3)  Zocor  Past History:  Past Medical History: Last updated: 05/25/2010 HYPERTENSION (ICD-401.9) HYPERLIPIDEMIA (ICD-272.4) LEUKOPENIA, CHRONIC (ICD-288.50)  hx of  hem consult    years ago.  HYPERSOMNIA, ASSOCIATED WITH SLEEP APNEA (ICD-780.53)- on CPAP ASTHMA (ICD-V17.5) GERD (ICD-530.1) ALLERGIC RHINITIS (ICD-477.9) DIVERTICULITIS, HX OF (ICD-V12.79) ANXIETY (ICD-300.00) ANEMIA-NOS (ICD-285.9)with borderline low wbc and neg heme eval 10 years ago.    Syncope episode  2010  echo ok  Cards consult Multinodular goiter   2011  get yearly [place] Cataracts  Past Surgical History: Last updated: 09/24/2007 EGD  10/99, 07/00 echo 06/03  micro [facility] ECHO  2007 colonoscopy, diverticuli/1 perf  10/99 Hysterectomy, TAH  Review of Systems  The patient denies anorexia, fever, weight loss, weight gain, vision loss, decreased hearing, hoarseness, chest pain, syncope, dyspnea on exertion, peripheral edema, prolonged cough, headaches, hemoptysis, abdominal pain, melena, hematochezia, severe indigestion/heartburn, hematuria, muscle weakness, suspicious skin lesions, difficulty walking, depression, unusual weight change, abnormal bleeding, enlarged lymph nodes, and angioedema.    Vital Signs:  Patient profile:  77 year old female Menstrual status:  hysterectomy Height:      63 inches Weight:      162 pounds BMI:     28.80 O2 Sat:      96 % on Room air Temp:     98.2 degrees F oral Pulse rate:   60 / minute BP sitting:   152 / 88  (left arm) Cuff size:   regular  Vitals Entered By: Zackery Barefoot CMA (September 28, 2010 2:15 PM)  O2 Flow:  Room air CC: Pt here for follow up on Hypersomnia. Pt states has not been able to  use machine because mask does not fit. Pt wants a different DME company having problems getting supplies from [facility] Comments Medications reviewed with patient Verified contact number and pharmacy with patient Zackery Barefoot CMA  September 28, 2010 2:16 PM    Physical Exam  Additional Exam:  Gen. Pleasant, well-nourished, in no distress ENT - no lesions, no post nasal drip Neck: No JVD, no thyromegaly, no carotid bruits Lungs: no use of accessory muscles, no dullness to percussion, clear without rales or rhonchi  Cardiovascular: Rhythm regular, heart sounds  normal, no murmurs or gallops, no peripheral edema Musculoskeletal: No deformities, no cyanosis or clubbing      Impression & Recommendations:  Problem # 1:  HYPERSOMNIA, ASSOCIATED WITH SLEEP APNEA (ICD-780.53) She did seem to have some benefits fomr CPAP. I have persuaded her to get back on it. We will get her a new respironics nasal mask - the same type that she seems ot have  liked. I am not sure compliance will be a great issue here  - since we are aiming for subjective improvement here rather than cardiac benefits Compliance encouraged, wt loss emphasized, asked to avoid meds with sedative side effects, cautioned against driving when sleepy.  Orders: Est. Patient Level III (54098) DME Referral (DME)  Medications Added to Medication List This Visit: 1)  Prilosec Otc 20 Mg Tbec (Omeprazole magnesium) .... Take 1 tablet by mouth once a day  Patient Instructions: 1)  Copy sent to: 2)  Please schedule a follow-up appointment in 1 year. 3)  Get back on CPAP machine 4)  We will get you a new supply company   Immunization History:  Influenza Immunization History:    Influenza:  historical (05/01/2010)

## 2010-10-29 IMAGING — CT CT HEAD W/O CM
1 series · 16 of 28 positions shown, 20 images · non-contrast
Comparison: 02/14/2006

CLINICAL DATA: Headaches and dizziness.

CT HEAD WITHOUT CONTRAST
TECHNIQUE: Contiguous axial images were obtained from the base of
the skull through the vertex without contrast.

[Series 2: head_seq -c 4.5 h37s st · axial · 0.40mm/px · z∈[+1207,+1319]mm · 16 of 28 slices shown, 20 images]
[im 2/28  brain]
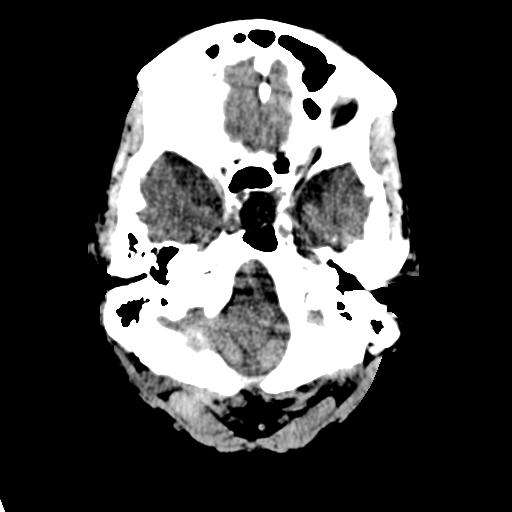
[im 2/28  bone]
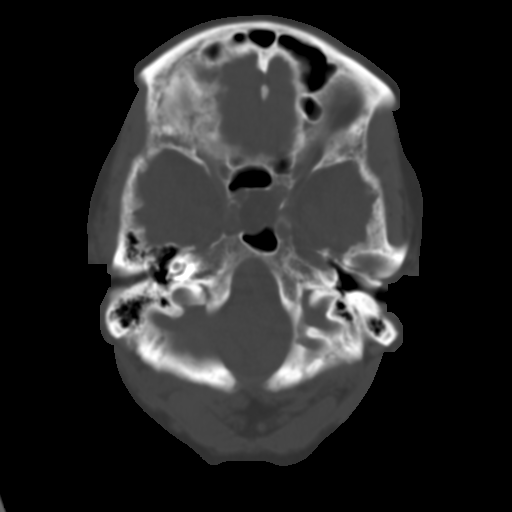
[im 4/28  brain]
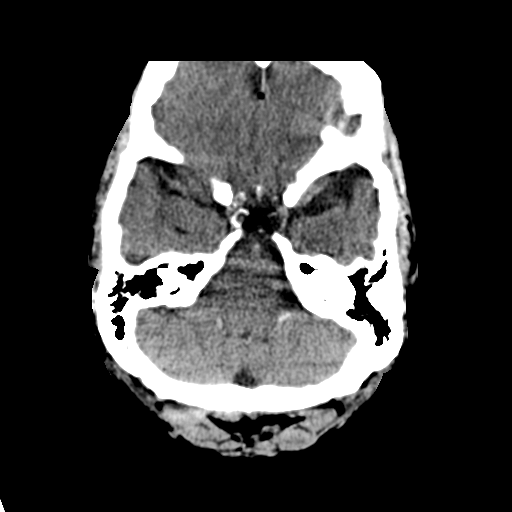
[im 6/28  brain]
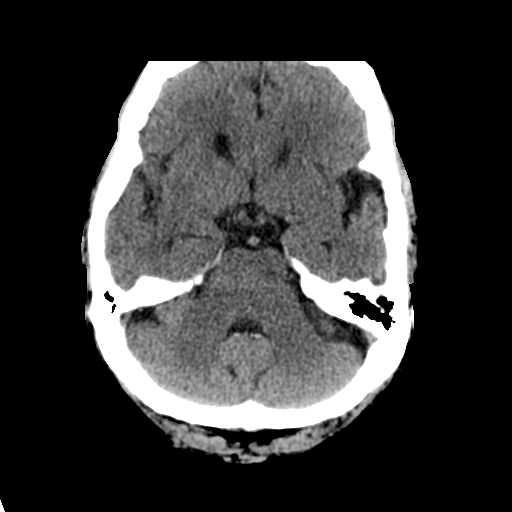
[im 7/28  brain]
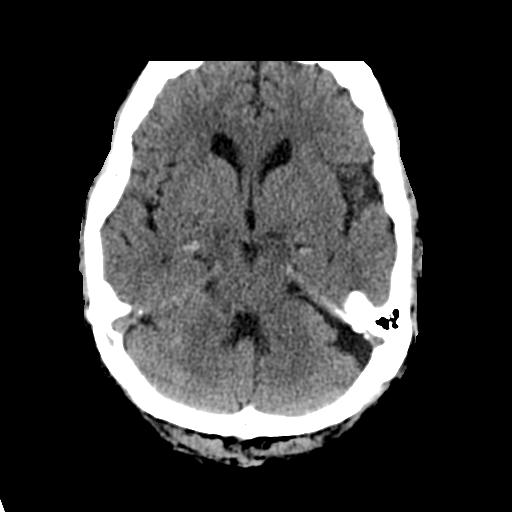
[im 9/28  brain]
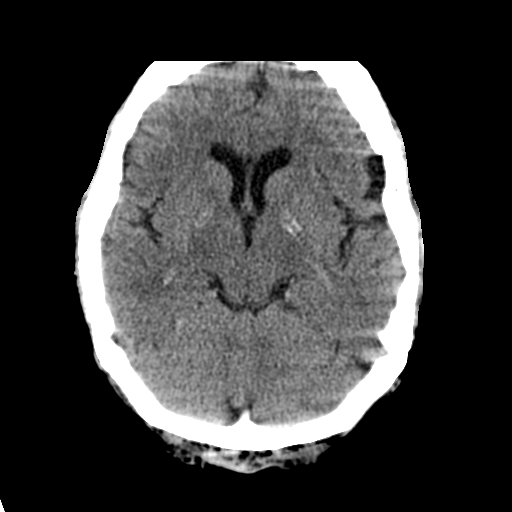
[im 9/28  bone]
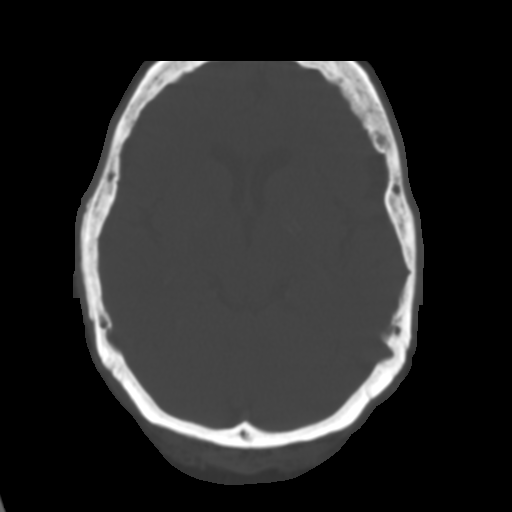
[im 10/28  brain]
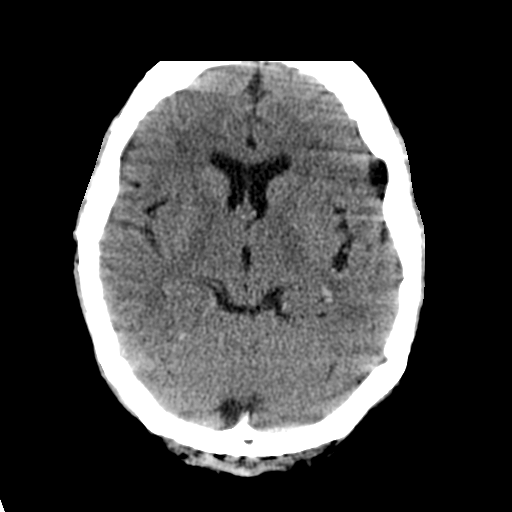
[im 12/28  brain]
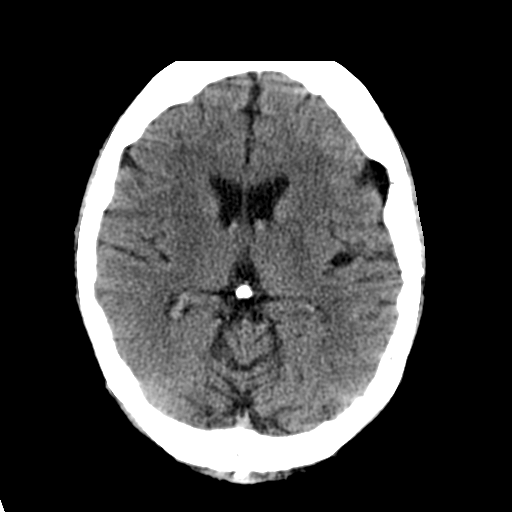
[im 14/28  brain]
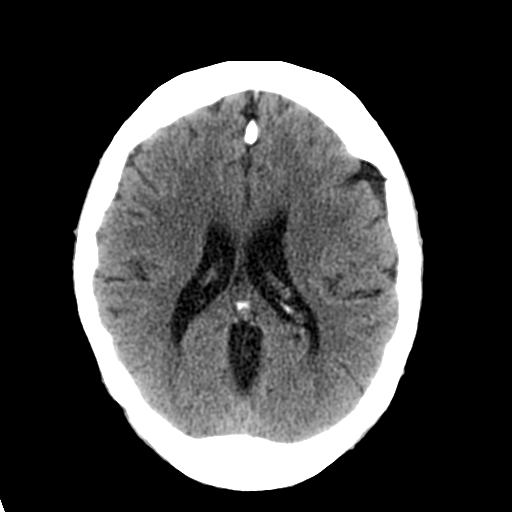
[im 15/28  brain]
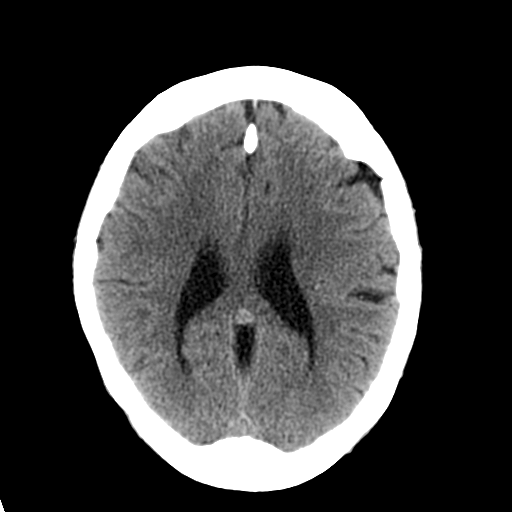
[im 15/28  bone]
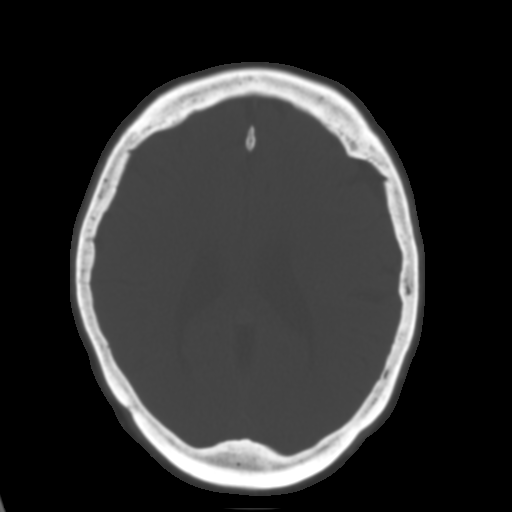
[im 17/28  brain]
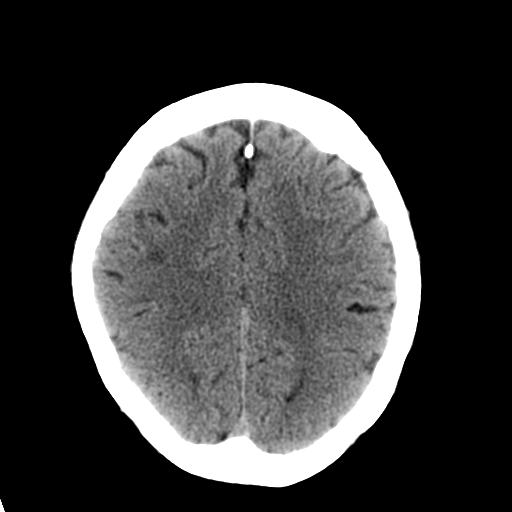
[im 19/28  brain]
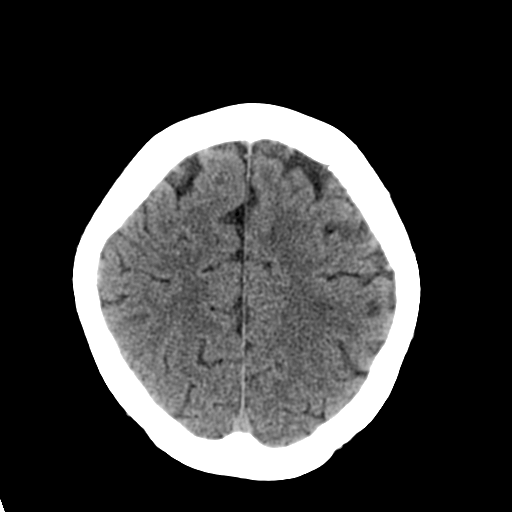
[im 20/28  brain]
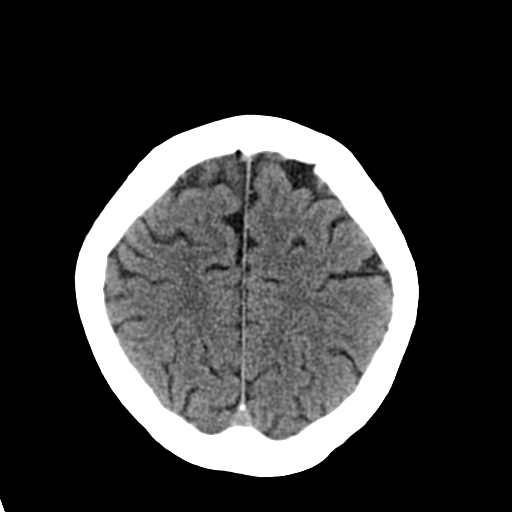
[im 22/28  brain]
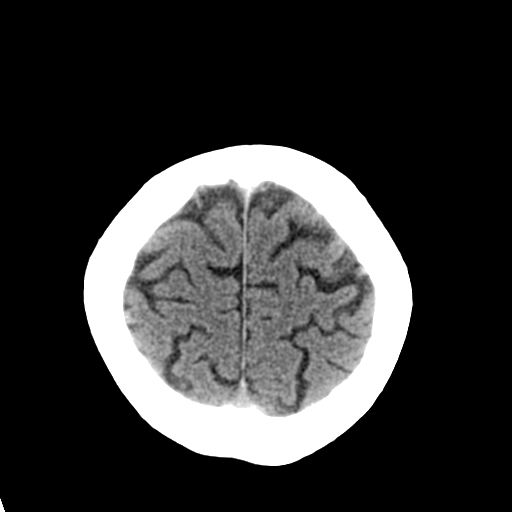
[im 22/28  bone]
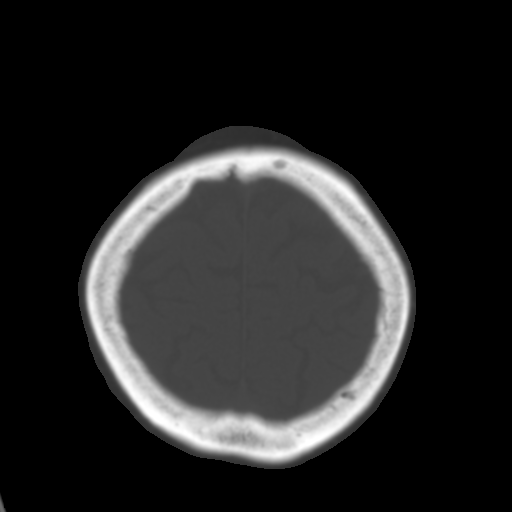
[im 23/28  brain]
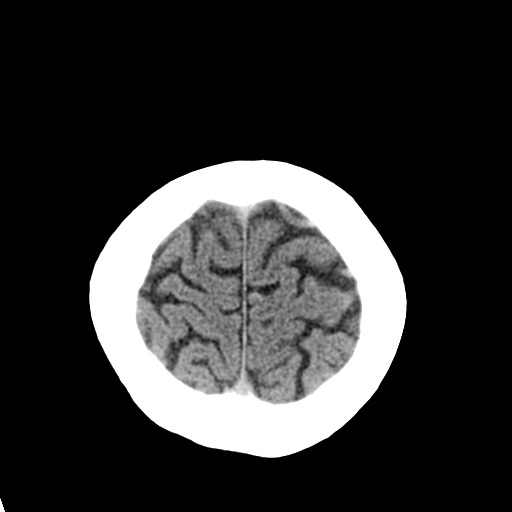
[im 25/28  brain]
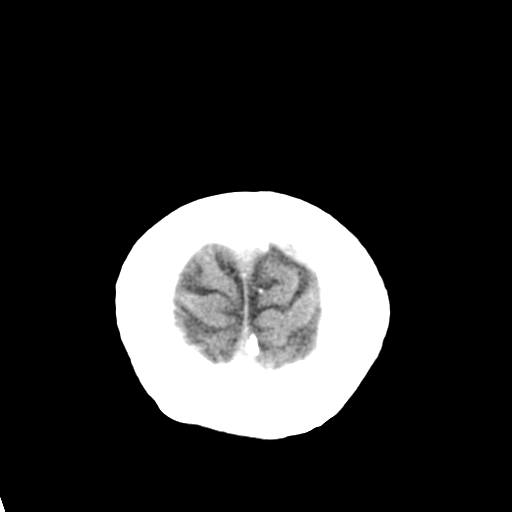
[im 27/28  brain]
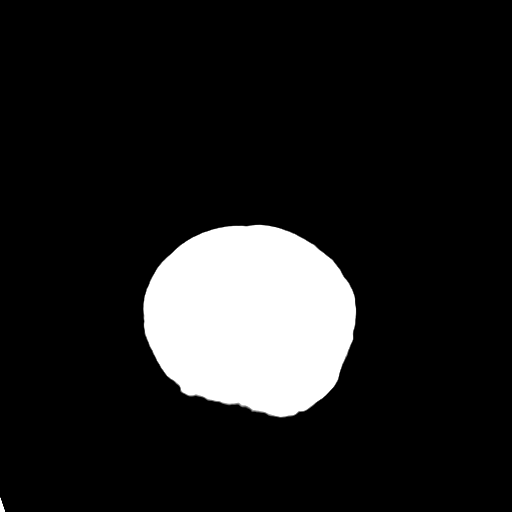

[16 of 28 positions shown; findings below may reference images not displayed]

FINDINGS: No acute intracranial findings.  Negative for mass
lesion, hemorrhage, acute infarction, or hydrocephalus.  Incidental
empty sella.  Aerated paranasal mastoid sinuses.
IMPRESSION: No acute intracranial findings.

## 2010-11-06 ENCOUNTER — Ambulatory Visit: Payer: Self-pay | Admitting: Internal Medicine

## 2010-11-16 DIAGNOSIS — I2510 Atherosclerotic heart disease of native coronary artery without angina pectoris: Secondary | ICD-10-CM | POA: Insufficient documentation

## 2010-12-05 ENCOUNTER — Other Ambulatory Visit: Payer: Self-pay

## 2010-12-06 ENCOUNTER — Encounter: Payer: Self-pay | Admitting: Internal Medicine

## 2010-12-06 ENCOUNTER — Ambulatory Visit: Payer: Self-pay | Admitting: Internal Medicine

## 2010-12-06 ENCOUNTER — Ambulatory Visit (INDEPENDENT_AMBULATORY_CARE_PROVIDER_SITE_OTHER): Payer: Medicare Other | Admitting: Internal Medicine

## 2010-12-06 VITALS — BP 150/80 | HR 62 | Ht 63.0 in | Wt 158.0 lb

## 2010-12-06 DIAGNOSIS — Z23 Encounter for immunization: Secondary | ICD-10-CM

## 2010-12-06 DIAGNOSIS — D649 Anemia, unspecified: Secondary | ICD-10-CM

## 2010-12-06 DIAGNOSIS — G4730 Sleep apnea, unspecified: Secondary | ICD-10-CM

## 2010-12-06 DIAGNOSIS — Z0000 Encounter for general adult medical examination without abnormal findings: Secondary | ICD-10-CM

## 2010-12-06 DIAGNOSIS — E785 Hyperlipidemia, unspecified: Secondary | ICD-10-CM

## 2010-12-06 DIAGNOSIS — D72819 Decreased white blood cell count, unspecified: Secondary | ICD-10-CM

## 2010-12-06 DIAGNOSIS — F411 Generalized anxiety disorder: Secondary | ICD-10-CM

## 2010-12-06 DIAGNOSIS — J309 Allergic rhinitis, unspecified: Secondary | ICD-10-CM

## 2010-12-06 DIAGNOSIS — K219 Gastro-esophageal reflux disease without esophagitis: Secondary | ICD-10-CM

## 2010-12-06 DIAGNOSIS — I10 Essential (primary) hypertension: Secondary | ICD-10-CM

## 2010-12-06 LAB — HEPATIC FUNCTION PANEL
Albumin: 4 g/dL (ref 3.5–5.2)
Alkaline Phosphatase: 79 U/L (ref 39–117)

## 2010-12-06 LAB — LIPID PANEL
Cholesterol: 210 mg/dL — ABNORMAL HIGH (ref 0–200)
HDL: 59.2 mg/dL (ref >39.00)
Triglycerides: 97 mg/dL (ref 0.0–149.0)

## 2010-12-06 LAB — BASIC METABOLIC PANEL
BUN: 18 mg/dL (ref 6–23)
CO2: 29 mEq/L (ref 19–32)
Calcium: 9.8 mg/dL (ref 8.4–10.5)
Chloride: 104 mEq/L (ref 96–112)
Creatinine, Ser: 1.2 mg/dL (ref 0.4–1.2)

## 2010-12-06 LAB — CBC WITH DIFFERENTIAL/PLATELET
Basophils Absolute: 0 10*3/uL (ref 0.0–0.1)
Basophils Relative: 0.7 % (ref 0.0–3.0)
Eosinophils Absolute: 0.1 10*3/uL (ref 0.0–0.7)
Lymphocytes Relative: 47.2 % — ABNORMAL HIGH (ref 12.0–46.0)
MCHC: 33.5 g/dL (ref 30.0–36.0)
MCV: 86.3 fl (ref 78.0–100.0)
Monocytes Absolute: 0.3 10*3/uL (ref 0.1–1.0)
Neutrophils Relative %: 39.6 % — ABNORMAL LOW (ref 43.0–77.0)
Platelets: 178 10*3/uL (ref 150.0–400.0)
RDW: 15.3 % — ABNORMAL HIGH (ref 11.5–14.6)

## 2010-12-06 LAB — LDL CHOLESTEROL, DIRECT: Direct LDL: 133.4 mg/dL

## 2010-12-06 NOTE — Patient Instructions (Signed)
Will notify you  of labs when available. Continue lifestyle intervention healthy eating and exercise . Recheck in 6 month or as needed

## 2010-12-06 NOTE — Progress Notes (Signed)
Subjective:    Cassandra English is a 77 y.o. female who presents for wellness Medicare exam and followup of multiple medical issues.  Cataracts; had one removed already and doing well to have another. HT no change in medication. GERD trying to keep that her flares up with spicy foods no change or dysphagia Dental had a number of dental work with some jaw pain TMJ stable  Lipids : Taking medication no side effects noted  Allergic rhinitis   doing pretty well through the spring but did have a large local reaction to one of her recent shots will follow up with Dr. Corinda Gubler  Obstructive sleep apnea and hypersomnia   under treatment  Ongoing fatigue   about the same she is as active as she can be up for herself but usually lives in a senior citizens apartment complex.   Cardiac risk factors: advanced age (older than 62 for men, 92 for women) and hypertension.  Activities of Daily Living  In your present state of health, do you have any difficulty performing the following activities?:  Preparing food and eating?: No Bathing yourself: No Getting dressed: No Using the toilet:No Moving around from place to place: No In the past year have you fallen or had a near fall?:No  Current exercise habits: Home exercise routine includes walking 1/2 hrs per week.   Dietary issues discussed: eating pretty healthy.   Depression Screen (Note: if answer to either of the following is "Yes", then a more complete depression screening is indicated)  Q1: Over the past two weeks, have you felt down, depressed or hopeless?no Q2: Over the past two weeks, have you felt little interest or pleasure in doing things? no   Hearing:  Slightly decreased  Vision:  Cataract surgery has glasses  Edrick Oh well   Safety: No falls .  Has smoke detector and wears seat belts.  No firearms. No excess sun exposure. Sees dentist regularly   Advance directive :  Reviewed    Working on this  Has 3 sons. Memory:  good  The  following portions of the patient's history were reviewed and updated as appropriate: allergies, current medications, past family history, past medical history, past social history, past surgical history and problem list. Review of Systems Pertinent items are noted in HPI.    Objective:     Vision done by Dr. Dierdre Highman on [place], done 03/30/2010 had cateracts removed in March 2012. Blood pressure 150/80, pulse 62, height 5\' 3"  (1.6 m), weight 158 lb (71.668 kg). Body mass index is 27.99 kg/(m^2). BP 150/80  Pulse 62  Ht 5\' 3"  (1.6 m)  Wt 158 lb (71.668 kg)  BMI 27.99 kg/m2  General Appearance:    Alert, cooperative, no distress, appears stated age  Head:    Normocephalic, without obvious abnormality, atraumatic  Eyes:    PERRL, conjunctivaclear, EOM's intact, , both eyes  Ears:    Normal TM's and external ear canals, both ears  Nose:   Nares normal, septum midline, mucosa normal, no drainage    or sinus tenderness  Throat:   Lips, mucosa, and tongue normal; teeth and gums normal  Neck:   Supple, symmetrical, trachea midline, no adenopathy;    thyroid:  no /tenderness/nodules easy to feel ; no carotid   bruit or JVD  Back:     Symmetric, no curvature, ROM normal, no CVA tenderness  Lungs:     Clear to auscultation bilaterally, respirations unlabored  Chest Wall:    No tenderness  or deformity   Heart:    Regular rate and rhythm, S1 and S2 normal, no murmur, rub   or gallop  Breast Exam:    No tenderness, masses, or nipple abnormality  Abdomen:     Soft, non-tender, bowel sounds active all four quadrants,    no masses, no organomegaly        Extremities:   Extremities normal, atraumatic, no cyanosis or edema  Pulses:   2+ and symmetric all extremities  Skin:   Skin color, texture, turgor normal, no rashes or lesions  Lymph nodes:   Cervical, supraclavicular, and axillary nodes normal  Neurologic:   CN3-12  intact, normal strength, sensation and reflexes    Throughout Gait  wnl  Oriented x 3 and no noted deficits in memory, attention, and speech.       Assessment:  Wellness visit  Reviewed    Good mobility and  Self care and  memory Hypertension has been stable up slightly today avoid diuretics as they cause side effects. Leukopenia no history of hematologic evaluation this has been stable over the years. Anxiety   stable Fatigue  No change   OSA   Hyperlipidemia on medication no side effects noted. Allergic /asthma stable    On continue shots    Per Dr Corinda Gubler. Discussed treatment plan for above problems and continue meds for now.     Plan:  She should check with her insurance plan and about the shingles vaccine.   During the course of the visit the patient was educated and counseled about appropriate screening and preventive services including:   Td vaccine  Nutrition counseling    Discussed advance directives and working on this  Patient Instructions (the written plan) was given to the patient.   Medicare Attestation I have personally reviewed: The patient's medical and social history Their use of alcohol, tobacco or illicit drugs Their current medications and supplements The patient's functional ability including ADLs,fall risks, home safety risks, cognitive, and hearing and visual impairment Diet and physical activities Evidence for depression or mood disorders  The patient's weight, height, BMI, and visual acuity have been recorded in the chart.  I have made referrals, counseling, and provided education to the patient based on review of the above and I have provided the patient with a written personalized care plan for preventive services.

## 2010-12-10 ENCOUNTER — Encounter: Payer: Self-pay | Admitting: Internal Medicine

## 2010-12-12 NOTE — Procedures (Signed)
Cassandra English, Cassandra English            ACCOUNT NO.:  [id number]   MEDICAL RECORD NO.:  [id number]          PATIENT TYPE:  OUT   LOCATION:  SLEEP CENTER                 FACILITY[facility]   PHYSICIAN:  Oretha Milch, MD      DATE OF BIRTH:  [birth year]   DATE OF STUDY:  07/15/2007                            NOCTURNAL POLYSOMNOGRAM   REFERRING PHYSICIAN:   This nocturnal polysomnogram was performed with a sleep technologist in  attendance.   INDICATION FOR STUDY:  Nonrestorative sleep, excessive daytime  sleepiness, and loud snoring with questionable sleep apnea.   EPWORTH SLEEPINESS SCORE:  Was 8.  Height 5 feet 3 inches, weight 160  pounds, neck size 14 inches.  BMI was 28.3.   MEDICATIONS:  At the time of the study included:  1. Aspirin 325 mg.  2. Cozaar 100 mg.  3. Lexapro 10 mg.  4. Lipitor 10 mg.  5. Prevacid 30 mg.  6. Vitamin D.  7. Norvasc 5 mg.   EEG, EKG, EMG, EOG, and respiratory parameters were recorded.  Arousal  and respiratory data were scored according to the criteria laid out by  the Medical Academy of Sleep Medicine.   SLEEP ARCHITECTURE:  Lights out was at 2109 and lights on was at 4:46  a.m.  Total sleep time was 331 minutes with a sleep efficiency of 72.4%.  Sleep latency was 8 minutes and awake after sleep onset was 118 minutes.  Sleep stages as a percentage of total sleep time was N1 15.1%, N2 81.7%,  N3 0%, and REM sleep was 3.2% (10.5 minutes).  Latency to REM sleep was  248 minutes.  Almost the entire period of sleep was in the supine  position.  Arousal summary:  She had a total of 42 awakenings.  There were a total  of 33 arousals leading to an arousal index of 6 events per hour.  Of  these, 24 were spontaneous and 5 were associated with desaturation, and  4 were associated with hypopneas.   RESPIRATORY DATA:  There were a total of 2 obstructive apneas, 0 central  apneas, 0 mixed apneas, and 58 hypopneas leading to an AHI of 10.9  events per hour.   Three hypopneas were observed during REM sleep (10.5  minutes) leading to a REM AHI of 17.1 events per hour.  Only 3 RERA's  were observed leading to an RDI of 11.5 events per hour.   OXYGEN DATA:  The minimum saturation was 85% during REM sleep and during  non-REM sleep.  She spent a total of 4 minutes with a saturation of less  than 90% and 0.8 minutes with a saturation of less than 88%.  The  desaturation index was 20.3 events per hour.   CARDIAC DATA:  EKG data:  The minimum heart rate was 45.3 during non-REM  sleep and 49 during REM sleep.  The maximum heart rate was 76.9 during  non-REM sleep and 75.9 during REM sleep.  No arrhythmias were noted.   MOVEMENT-PARASOMNIA:  There were 0 limb movements observed.   DISCUSSION:  Sleep architecture was characteristic of that of the  elderly with no slow wave sleep and  very little REM sleep.  (This may be  related to Lexapro.)  Predominant hypopneas were observed.   IMPRESSIONS:  1. Mild sleep disordered breathing with predominant hypopneas causing      desaturations and sleep fragmentation.  2. No evidence of cardiac arrhythmias, limb movements, or REM behavior      disorder.   RECOMMENDATIONS:  1. If the patient remains sleepy, I would recommend treatment even      though the degree of sleep disordered breathing is mild.  Treatment      options for this degree of sleep apnea would involve oral      appliances, positive airway pressure, or upper airway surgery.  2. The patient should be cautioned against using medications with      sedative side effects or driving while sleepy.      Oretha Milch, MD  Electronically Signed     RVA/MEDQ  D:  07/21/2007 14:50:19  T:  07/21/2007 19:30:28  Job:  [id number]   cc:   Neta Mends. Fabian Sharp, MD  [address] [place], [place] [address]

## 2010-12-12 NOTE — Assessment & Plan Note (Signed)
[facility]                         GASTROENTEROLOGY OFFICE NOTE   KSENIYA, GRUNDEN                   MRN:          [id number]  DATE:10/20/2007                            DOB:          [birth year]    GI PROBLEM LIST:  1. Previous colon polyp removed in 1999 by Dr. Victorino Dike.  This was      removed, but not retrieved and so pathology from that polyp is      unknown.  Repeat colonoscopy October 2004, done for the indication      (history of adenomatous polyps) was normal except for      diverticulosis.  2. GERD, known hiatal hernia.  Last upper endoscopy in July 2000 by      Dr. Victorino Dike.  Schatzki's ring was noted and dilated with a 52-      Jamaica Maloney.   INTERVAL HISTORY:  Ms. Yerian was last see several years ago.  She had  a colonoscopy in 2004 that was found no polyps.  She was recommended to  have another one in 5 years.  She was referred back to discuss that.   CURRENT MEDICATIONS:  Prevacid, Lipitor, Cozaar, Amlodipine, Vitamin D,  Lexapro, aspirin.   PHYSICAL EXAMINATION:  5 feet 3 inches, 165 pounds.  Blood pressure  176/90.  Pulse 56.  CONSTITUTIONAL:  Generally well-appearing.  LUNGS:  Clear to auscultation bilaterally.  HEART:  Regular rate and rhythm.  ABDOMEN:  Soft, nontender., nondistended, normal bowel sounds.   ASSESSMENT AND PLAN:  A 77 year old woman with personal history of colon  polyps (pathology not available).   She last had a colonoscopy 4-1/2 year ago by Dr. Victorino Dike and he  recommended  repeat colonoscopy 5 years from that point.  Although we do  not have previous pathology from the polyp that was removed in 1999,  showing that it indeed was a precancerous polyp, I think it is  reasonable to repeat the colonoscopy that Dr. Doreatha Martin recommended in October  of this year.  If, at that point she only has 1 or 2 small polyps at  worse, then she would probably need no further colorectal cancer  screening  for routine purposes.  We will therefore put her back in out  reminding system for a colonoscopy October 2009, and she knows to get in  touch if she has any further questions.  I did ask her about her history  of GERD symptoms and dysphagia and she has no problems to that effect.     Rachael Fee, MD  Electronically Signed    DPJ/MedQ  DD: 10/20/2007  DT: 10/20/2007  Job #: [id number]   cc:   Neta Mends. Fabian Sharp, MD

## 2010-12-18 ENCOUNTER — Telehealth: Payer: Self-pay | Admitting: *Deleted

## 2010-12-18 NOTE — Telephone Encounter (Signed)
See lab result note   . Tell her results  .

## 2010-12-18 NOTE — Telephone Encounter (Signed)
Pt is wanting her lab results  

## 2010-12-19 ENCOUNTER — Encounter: Payer: Self-pay | Admitting: *Deleted

## 2010-12-19 NOTE — Telephone Encounter (Signed)
Tried to call pt but couldn't leave her a message. I mailed pt a letter about her results.

## 2011-05-09 ENCOUNTER — Other Ambulatory Visit: Payer: Self-pay | Admitting: Internal Medicine

## 2011-05-15 ENCOUNTER — Encounter: Payer: Self-pay | Admitting: Gastroenterology

## 2011-05-15 ENCOUNTER — Telehealth: Payer: Self-pay | Admitting: Internal Medicine

## 2011-05-15 NOTE — Telephone Encounter (Signed)
Wants her throat stretched again. Please her back to [facility]. Thanks.

## 2011-05-15 NOTE — Telephone Encounter (Signed)
Spoke to pt and told her to call [facility] about this.

## 2011-06-01 ENCOUNTER — Ambulatory Visit (INDEPENDENT_AMBULATORY_CARE_PROVIDER_SITE_OTHER): Payer: Medicare Other | Admitting: Gastroenterology

## 2011-06-01 ENCOUNTER — Encounter: Payer: Self-pay | Admitting: Gastroenterology

## 2011-06-01 ENCOUNTER — Other Ambulatory Visit: Payer: Self-pay | Admitting: Gastroenterology

## 2011-06-01 VITALS — BP 142/86 | HR 60 | Ht 63.0 in | Wt 159.0 lb

## 2011-06-01 DIAGNOSIS — R1310 Dysphagia, unspecified: Secondary | ICD-10-CM

## 2011-06-01 NOTE — Progress Notes (Signed)
HPI: This is a   very pleasant 77-year-old woman who I am meeting for the first time today.  She gets "choked."  This happens about once a week or so.  Feels like food stops in throat, will regurg to get it out usually.  She had EGD, dilation MANY years ago.  She has pyrosis chronically. She takes prilosec once daily and this cmpletely helps.  Her weight is stable.  NO overt GI bleeding.  She had a colonoscopy in 2004, no polyps were found.    Review of systems: Pertinent positive and negative review of systems were noted in the above HPI section. Complete review of systems was performed and was otherwise normal.    Past Medical History  Diagnosis Date  . HTN (hypertension)   . Hyperlipidemia   . Leukopenia     chronic. hx of hem consult years ago  . Hypersomnia with sleep apnea     on CPAP  . Asthma   . GERD (gastroesophageal reflux disease)     EGD 10 99 and 7 00  . ALLERGIC RHINITIS   . Diverticulitis      1 perf colon 10 99  . Anxiety   . Anemia     with borderline low wbc and neg heme eval 10 years ago.   . Syncope 2010    echo ok. cards consult    . Multinodular goiter 2011    get yearly US  . Cataract     Past Surgical History  Procedure Date  . Total abdominal hysterectomy   . Cataract extraction     Current Outpatient Prescriptions  Medication Sig Dispense Refill  . aspirin 81 MG tablet Take 81 mg by mouth daily.        . atorvastatin (LIPITOR) 20 MG tablet Take 20 mg by mouth. Take 1/2 tab by mouth once daily.       . cholecalciferol (VITAMIN D-400) 400 UNIT TABS Take 400 Units by mouth daily.        . escitalopram (LEXAPRO) 10 MG tablet Take 10 mg by mouth. Take 1/2 tab by mouth every other day for a week and then stop or use as needed       . losartan (COZAAR) 50 MG tablet 1/2 tab daily- Pt needs to schedule a follow up appt before next refill.  30 tablet  0  . Multiple Vitamin (MULTIVITAMIN) capsule        . NON FORMULARY Allergy Vaccine.  As directed  per Dr. Eugene Carnuel       . NON FORMULARY CPAP machine.  Use as directed at bedtime.       . omeprazole (PRILOSEC OTC) 20 MG tablet 20 mg.          Allergies as of 06/01/2011 - Review Complete 06/01/2011  Allergen Reaction Noted  . Azithromycin    . Propoxyphene n-acetaminophen    . Simvastatin      Family History  Problem Relation Age of Onset  . Coronary artery disease Mother   . Coronary artery disease Father   . Stroke Sister   . Colon cancer Neg Hx     History   Social History  . Marital Status: Divorced    Spouse Name: N/A    Number of Children: 3  . Years of Education: N/A   Occupational History  . Retired     Social History Main Topics  . Smoking status: Never Smoker   . Smokeless tobacco: Never Used  .   Alcohol Use: No  . Drug Use: No  . Sexually Active: Not on file   Other Topics Concern  . Not on file   Social History Narrative   Lives alone.[address] Area Divorced 3 sonsDesignated Party Release signed on 0 caffeine  Drinks daily        Physical Exam: BP 142/86  Pulse 60  Ht 5' 3" (1.6 m)  Wt 159 lb (72.122 kg)  BMI 28.17 kg/m2 Constitutional: generally well-appearing Psychiatric: alert and oriented x3 Eyes: extraocular movements intact Mouth: oral pharynx moist, no lesions Neck: supple no lymphadenopathy Cardiovascular: heart regular rate and rhythm Lungs: clear to auscultation bilaterally Abdomen: soft, nontender, nondistended, no obvious ascites, no peritoneal signs, normal bowel sounds Extremities: no lower extremity edema bilaterally Skin: no lesions on visible extremities    Assessment and plan: 77 y.o. female with  intermittent, nonprogressive, chronic solid food dysphagia  I will proceed with EGD at her soonest convenience. We will do this at [facility], she is not sure that someone would be able to stay with her and wait with her during the procedure which is the policy at the [facility]. She  is pretty certain that someone can drive her home afterwards however. She understands that if she did not have a ride following the procedure she would have to be admitted to the hospital.  

## 2011-06-01 NOTE — Patient Instructions (Addendum)
You will be set up for an upper endoscopy at [facility] hospital for dilation with balloon. In meantime, eat your food very slowly and chew well. Take small bites.

## 2011-06-14 ENCOUNTER — Encounter (HOSPITAL_COMMUNITY)
Admission: RE | Disposition: A | Discharge status: Home / Self Care | Payer: Self-pay | Source: Ambulatory Visit | Attending: Gastroenterology

## 2011-06-14 ENCOUNTER — Encounter (HOSPITAL_COMMUNITY): Payer: Self-pay | Admitting: Gastroenterology

## 2011-06-14 ENCOUNTER — Ambulatory Visit (HOSPITAL_COMMUNITY)
Admission: RE | Admit: 2011-06-14 | Discharge: 2011-06-14 | Disposition: A | Discharge status: Home / Self Care | Payer: Medicare Other | Source: Ambulatory Visit | Attending: Gastroenterology | Admitting: Gastroenterology

## 2011-06-14 DIAGNOSIS — K449 Diaphragmatic hernia without obstruction or gangrene: Secondary | ICD-10-CM | POA: Insufficient documentation

## 2011-06-14 DIAGNOSIS — K219 Gastro-esophageal reflux disease without esophagitis: Secondary | ICD-10-CM | POA: Insufficient documentation

## 2011-06-14 DIAGNOSIS — K222 Esophageal obstruction: Secondary | ICD-10-CM | POA: Insufficient documentation

## 2011-06-14 DIAGNOSIS — E785 Hyperlipidemia, unspecified: Secondary | ICD-10-CM | POA: Insufficient documentation

## 2011-06-14 DIAGNOSIS — R1310 Dysphagia, unspecified: Secondary | ICD-10-CM | POA: Insufficient documentation

## 2011-06-14 DIAGNOSIS — I10 Essential (primary) hypertension: Secondary | ICD-10-CM | POA: Insufficient documentation

## 2011-06-14 DIAGNOSIS — G4730 Sleep apnea, unspecified: Secondary | ICD-10-CM | POA: Insufficient documentation

## 2011-06-14 DIAGNOSIS — Z7982 Long term (current) use of aspirin: Secondary | ICD-10-CM | POA: Insufficient documentation

## 2011-06-14 DIAGNOSIS — G4710 Hypersomnia, unspecified: Secondary | ICD-10-CM | POA: Insufficient documentation

## 2011-06-14 DIAGNOSIS — Z79899 Other long term (current) drug therapy: Secondary | ICD-10-CM | POA: Insufficient documentation

## 2011-06-14 DIAGNOSIS — J45909 Unspecified asthma, uncomplicated: Secondary | ICD-10-CM | POA: Insufficient documentation

## 2011-06-14 SURGERY — ESOPHAGOGASTRODUODENOSCOPY (EGD) WITH ESOPHAGEAL DILATION
Anesthesia: Moderate Sedation

## 2011-06-14 MED ORDER — LACTATED RINGERS IV SOLN
INTRAVENOUS | Status: DC
  Administered 2011-06-14: 08:00:00 via INTRAVENOUS

## 2011-06-14 MED ORDER — MIDAZOLAM HCL 10 MG/2ML IJ SOLN
INTRAMUSCULAR | Status: DC | PRN
  Administered 2011-06-14 (×3): 1 mg via INTRAVENOUS

## 2011-06-14 MED ORDER — FENTANYL CITRATE 0.05 MG/ML IJ SOLN
INTRAMUSCULAR | Status: AC
  Filled 2011-06-14: qty 2

## 2011-06-14 MED ORDER — BUTAMBEN-TETRACAINE-BENZOCAINE 2-2-14 % EX AERO
INHALATION_SPRAY | CUTANEOUS | Status: DC | PRN
  Administered 2011-06-14: 1 via TOPICAL

## 2011-06-14 MED ORDER — MIDAZOLAM HCL 10 MG/2ML IJ SOLN
INTRAMUSCULAR | Status: AC
  Filled 2011-06-14: qty 2

## 2011-06-14 MED ORDER — FENTANYL CITRATE 0.05 MG/ML IJ SOLN
INTRAMUSCULAR | Status: DC | PRN
  Administered 2011-06-14: 25 ug via INTRAVENOUS

## 2011-06-14 NOTE — Interval H&P Note (Signed)
History and Physical Interval Note:   06/14/2011   8:17 AM   Cassandra English  has presented today for surgery, with the diagnosis of Dysphagia [787.20]  The various methods of treatment have been discussed with the patient and family. After consideration of risks, benefits and other options for treatment, the patient has consented to  Procedure(s): ESOPHAGOGASTRODUODENOSCOPY (EGD) WITH ESOPHAGEAL DILATION as a surgical intervention .  The patients' history has been reviewed, patient examined, no change in status, stable for surgery.  I have reviewed the patients' chart and labs.  Questions were answered to the patient's satisfaction.     Rob Bunting  MD

## 2011-06-14 NOTE — H&P (View-Only) (Signed)
HPI: This is a   very pleasant 77 year old woman who I am meeting for the first time today.  She gets "choked."  This happens about once a week or so.  Feels like food stops in throat, will regurg to get it out usually.  She had EGD, dilation MANY years ago.  She has pyrosis chronically. She takes prilosec once daily and this cmpletely helps.  Her weight is stable.  NO overt GI bleeding.  She had a colonoscopy in 2004, no polyps were found.    Review of systems: Pertinent positive and negative review of systems were noted in the above HPI section. Complete review of systems was performed and was otherwise normal.    Past Medical History  Diagnosis Date  . HTN (hypertension)   . Hyperlipidemia   . Leukopenia     chronic. hx of hem consult years ago  . Hypersomnia with sleep apnea     on CPAP  . Asthma   . GERD (gastroesophageal reflux disease)     EGD 10 99 and 7 00  . ALLERGIC RHINITIS   . Diverticulitis      1 perf colon 10 99  . Anxiety   . Anemia     with borderline low wbc and neg heme eval 10 years ago.   . Syncope 2010    echo ok. cards consult    . Multinodular goiter 2011    get yearly [place]  . Cataract     Past Surgical History  Procedure Date  . Total abdominal hysterectomy   . Cataract extraction     Current Outpatient Prescriptions  Medication Sig Dispense Refill  . aspirin 81 MG tablet Take 81 mg by mouth daily.        Marland Kitchen atorvastatin (LIPITOR) 20 MG tablet Take 20 mg by mouth. Take 1/2 tab by mouth once daily.       . cholecalciferol (VITAMIN D-400) 400 UNIT TABS Take 400 Units by mouth daily.        Marland Kitchen escitalopram (LEXAPRO) 10 MG tablet Take 10 mg by mouth. Take 1/2 tab by mouth every other day for a week and then stop or use as needed       . losartan (COZAAR) 50 MG tablet 1/2 tab daily- Pt needs to schedule a follow up appt before next refill.  30 tablet  0  . Multiple Vitamin (MULTIVITAMIN) capsule        . NON FORMULARY Allergy Vaccine.  As directed  per Dr. [name]       . NON FORMULARY CPAP machine.  Use as directed at bedtime.       Marland Kitchen omeprazole (PRILOSEC OTC) 20 MG tablet 20 mg.          Allergies as of 06/01/2011 - Review Complete 06/01/2011  Allergen Reaction Noted  . Azithromycin    . Propoxyphene n-acetaminophen    . Simvastatin      Family History  Problem Relation Age of Onset  . Coronary artery disease Mother   . Coronary artery disease Father   . Stroke Sister   . Colon cancer Neg Hx     History   Social History  . Marital Status: Divorced    Spouse Name: N/A    Number of Children: 3  . Years of Education: N/A   Occupational History  . Retired     Social History Main Topics  . Smoking status: Never Smoker   . Smokeless tobacco: Never Used  .  Alcohol Use: No  . Drug Use: No  . Sexually Active: Not on file   Other Topics Concern  . Not on file   Social History Narrative   Lives alone.[address] Area Divorced 3 sonsDesignated Party Release signed on 0 caffeine  Drinks daily        Physical Exam: BP 142/86  Pulse 60  Ht 5\' 3"  (1.6 m)  Wt 159 lb (72.122 kg)  BMI 28.17 kg/m2 Constitutional: generally well-appearing Psychiatric: alert and oriented x3 Eyes: extraocular movements intact Mouth: oral pharynx moist, no lesions Neck: supple no lymphadenopathy Cardiovascular: heart regular rate and rhythm Lungs: clear to auscultation bilaterally Abdomen: soft, nontender, nondistended, no obvious ascites, no peritoneal signs, normal bowel sounds Extremities: no lower extremity edema bilaterally Skin: no lesions on visible extremities    Assessment and plan: 77 y.o. female with  intermittent, nonprogressive, chronic solid food dysphagia  I will proceed with EGD at her soonest convenience. We will do this at [facility], she is not sure that someone would be able to stay with her and wait with her during the procedure which is the policy [facility]. She  is pretty certain that someone can drive her home afterwards however. She understands that if she did not have a ride following the procedure she would have to be admitted to the hospital.

## 2011-06-15 ENCOUNTER — Encounter (HOSPITAL_COMMUNITY): Payer: Self-pay

## 2011-07-04 ENCOUNTER — Telehealth: Payer: Self-pay | Admitting: Gastroenterology

## 2011-07-04 NOTE — Telephone Encounter (Signed)
Pt received a call and thought it was from our office and about her procedure.  I advised her that we did not call but I did ask if she was ok and if she was having any problems.  She says she is doing fine and will call back if she needs anything

## 2011-07-08 ENCOUNTER — Other Ambulatory Visit: Payer: Self-pay | Admitting: Internal Medicine

## 2011-07-10 ENCOUNTER — Encounter: Payer: Self-pay | Admitting: Internal Medicine

## 2011-07-27 ENCOUNTER — Ambulatory Visit (INDEPENDENT_AMBULATORY_CARE_PROVIDER_SITE_OTHER): Payer: Medicare Other | Admitting: Internal Medicine

## 2011-07-27 ENCOUNTER — Encounter: Payer: Self-pay | Admitting: Internal Medicine

## 2011-07-27 VITALS — BP 152/80 | HR 60 | Wt 159.0 lb

## 2011-07-27 DIAGNOSIS — K219 Gastro-esophageal reflux disease without esophagitis: Secondary | ICD-10-CM

## 2011-07-27 DIAGNOSIS — R5381 Other malaise: Secondary | ICD-10-CM

## 2011-07-27 DIAGNOSIS — E785 Hyperlipidemia, unspecified: Secondary | ICD-10-CM

## 2011-07-27 DIAGNOSIS — R5383 Other fatigue: Secondary | ICD-10-CM

## 2011-07-27 DIAGNOSIS — I10 Essential (primary) hypertension: Secondary | ICD-10-CM

## 2011-07-27 DIAGNOSIS — G4710 Hypersomnia, unspecified: Secondary | ICD-10-CM

## 2011-07-27 NOTE — Patient Instructions (Signed)
Avoid salt and   And processed foods.  This can increase your BP.  Call if Bp readings are 150 and above for a while. Otherwise   Wellness visit in May 2013

## 2011-07-27 NOTE — Progress Notes (Signed)
  Subjective:    Patient ID: Judge Stall, female    DOB: 1927-05-16, 77 y.o.   MRN: [id number]  HPI Patient comes in today for follow up of  multiple medical problems.   BP med ok doing ok but could be up  From that.  Broth died recently and has been eatign differently  Sleep apnea helping cpap .  Doing well now.  No fainting  Bleeding.   Allergy  :   Arm swell after allergy shots.   Considering stopping shots.  Holding shots for a while.  GERD:  Taking meds for Hb reflux   Had esophagus stretch.  No more choking.  BrotherDied of prostate cancer age 28    This past month.  LIPIDS:  On samples no se of meds   Review of Systems Neg cp sob NVD abd pain  Falling  Still sleepy but cpap helps her  No bleeding had large rreaction to  Allergy shot  Off for  Short while dr Corinda Gubler is retiring Past history family history social history reviewed in the electronic medical record.       Objective:   Physical Exam  Wt Readings from Last 3 Encounters:  07/27/11 159 lb (72.122 kg)  06/14/11 156 lb (70.761 kg)  06/14/11 156 lb (70.761 kg)    WDWN in nad  Neck: Supple without adenopathy or masses or bruits  Thyroid palpable  No nodule felt Chest:  Clear to A&P without wheezes rales or rhonchi CV:  S1-S2 no gallops  peripheral perfusion is normal   ?  No murmur sitting or radiation Repeat BP  Right 156/88  Left 144/80 Abdomen:  Sof,t normal bowel sounds without hepatosplenomegaly, no guarding rebound or masses no CVA tenderness NEURO: oriented x 3 CN 3-12 appear intact. No focal muscle weakness or atrophy.. Gait WNL.  Grossly non focal. No tremor or abnormal movement. No clubbing cyanosis ; trc 1 edema edema      Assessment & Plan:  HT  Up today  Poss from diet change  Vs WC effect has been in range at home   Remote hs of sig se when try to get intensive control so for today will track and  Fu if elevated at home otherwise in 6 months. LIPIDS  Med now generic call if needed for  refill Fatigue   cpap helps   Mood stable recent death in family Allergic  GERd  Better  After dilitation for stricture  On   Omeprazole.

## 2011-07-29 ENCOUNTER — Encounter: Payer: Self-pay | Admitting: Internal Medicine

## 2011-07-29 NOTE — Assessment & Plan Note (Signed)
Better on meds 

## 2011-11-03 DIAGNOSIS — T23299A Burn of second degree of multiple sites of unspecified wrist and hand, initial encounter: Secondary | ICD-10-CM | POA: Diagnosis not present

## 2011-11-03 DIAGNOSIS — Z23 Encounter for immunization: Secondary | ICD-10-CM | POA: Diagnosis not present

## 2011-11-08 DIAGNOSIS — T23299A Burn of second degree of multiple sites of unspecified wrist and hand, initial encounter: Secondary | ICD-10-CM | POA: Diagnosis not present

## 2011-11-20 ENCOUNTER — Telehealth: Payer: Self-pay | Admitting: Internal Medicine

## 2011-11-20 NOTE — Telephone Encounter (Signed)
Pt called and has a mcr cpx sch for 12/10/11 but pt is going to be out of town. Pt wants to know if she could still come in in May for a fasting mcr cpx. Pls advise when would be a good date to sch?

## 2011-11-21 NOTE — Telephone Encounter (Signed)
Pt is aware waiting on MD °

## 2011-11-23 NOTE — Telephone Encounter (Signed)
Pt mailbox is full. Pt has been rsc to 01-02-2012

## 2011-11-23 NOTE — Telephone Encounter (Signed)
Not many am appts but how about June 5th at 1045 ? am

## 2011-11-23 NOTE — Telephone Encounter (Signed)
Pt is not aware of new appt

## 2011-11-26 NOTE — Telephone Encounter (Signed)
Pt is aware of new appt date time

## 2011-12-10 ENCOUNTER — Ambulatory Visit: Payer: Medicare Other | Admitting: Internal Medicine

## 2012-01-02 ENCOUNTER — Encounter: Payer: Self-pay | Admitting: Internal Medicine

## 2012-01-02 ENCOUNTER — Ambulatory Visit (INDEPENDENT_AMBULATORY_CARE_PROVIDER_SITE_OTHER): Payer: Medicare Other | Admitting: Internal Medicine

## 2012-01-02 VITALS — BP 130/80 | HR 60 | Temp 98.0°F | Ht 62.5 in | Wt 157.0 lb

## 2012-01-02 DIAGNOSIS — G4710 Hypersomnia, unspecified: Secondary | ICD-10-CM

## 2012-01-02 DIAGNOSIS — E785 Hyperlipidemia, unspecified: Secondary | ICD-10-CM

## 2012-01-02 DIAGNOSIS — Z0000 Encounter for general adult medical examination without abnormal findings: Secondary | ICD-10-CM | POA: Diagnosis not present

## 2012-01-02 DIAGNOSIS — R5383 Other fatigue: Secondary | ICD-10-CM | POA: Diagnosis not present

## 2012-01-02 DIAGNOSIS — D72819 Decreased white blood cell count, unspecified: Secondary | ICD-10-CM

## 2012-01-02 DIAGNOSIS — R55 Syncope and collapse: Secondary | ICD-10-CM | POA: Diagnosis not present

## 2012-01-02 DIAGNOSIS — R5381 Other malaise: Secondary | ICD-10-CM

## 2012-01-02 DIAGNOSIS — I10 Essential (primary) hypertension: Secondary | ICD-10-CM

## 2012-01-02 DIAGNOSIS — K219 Gastro-esophageal reflux disease without esophagitis: Secondary | ICD-10-CM | POA: Diagnosis not present

## 2012-01-02 LAB — POCT URINALYSIS DIP (MANUAL ENTRY)
Nitrite, UA: NEGATIVE
Spec Grav, UA: 1.015
Urobilinogen, UA: 0.2
pH, UA: 6

## 2012-01-02 LAB — HEPATIC FUNCTION PANEL
ALT: 15 U/L (ref 0–35)
AST: 26 U/L (ref 0–37)
Alkaline Phosphatase: 84 U/L (ref 39–117)
Bilirubin, Direct: 0.1 mg/dL (ref 0.0–0.3)
Total Protein: 7.5 g/dL (ref 6.0–8.3)

## 2012-01-02 LAB — BASIC METABOLIC PANEL
Chloride: 110 mEq/L (ref 96–112)
Creatinine, Ser: 1.1 mg/dL (ref 0.4–1.2)

## 2012-01-02 LAB — CBC WITH DIFFERENTIAL/PLATELET
Basophils Relative: 1.2 % (ref 0.0–3.0)
Eosinophils Relative: 2.6 % (ref 0.0–5.0)
Lymphocytes Relative: 36.7 % (ref 12.0–46.0)
MCV: 85.5 fl (ref 78.0–100.0)
Monocytes Absolute: 0.4 10*3/uL (ref 0.1–1.0)
Monocytes Relative: 10.7 % (ref 3.0–12.0)
Neutrophils Relative %: 48.8 % (ref 43.0–77.0)
RBC: 4.43 Mil/uL (ref 3.87–5.11)
WBC: 3.6 10*3/uL — ABNORMAL LOW (ref 4.5–10.5)

## 2012-01-02 LAB — TSH: TSH: 1.52 u[IU]/mL (ref 0.35–5.50)

## 2012-01-02 LAB — C-REACTIVE PROTEIN: CRP: <1 mg/dL (ref 1–20)

## 2012-01-02 LAB — LIPID PANEL: Total CHOL/HDL Ratio: 3

## 2012-01-02 LAB — SEDIMENTATION RATE: Sed Rate: 27 mm/hr — ABNORMAL HIGH (ref 0–22)

## 2012-01-02 NOTE — Patient Instructions (Signed)
Your exam is good today. I agree with having you seen the sleep specialist to see if they have any suggestions about your fatigue which sounds like sleepiness. If you're not sleeping well at night this can make you tired in the day.  It is possible that medication could be making a little tired.  We will take a trial off of the esciitalopram or Lexapro.   take a half a pill of the 10 mg  every 3 days for a week and then every 4 days per week and then try stopping. After 2 weeks see what your energy level is slight. If it is better and you're not anxious you can stay off the medication. If you're anxiety comes back we may restart the medicine.  Will notify you  of labs when available. ROV in 4 months or as needed.

## 2012-01-02 NOTE — Progress Notes (Signed)
Subjective:    Patient ID: Cassandra English, female    DOB: 01/26/1927, 78 y.o.   MRN: [id number]  HPI Patient comes in today for preventive visit and follow-up of medical issues. Update  history since  last visit: More tired recently   Feels almost sick  Trying to eat healthy and lose weight. Still walking not as much  But no specific chest pain shortness of breath. Feels dizzy or lightheaded at times and will sit down. Denies palpitations unusual sweating her allergies seem to be stable and is taking time off of her allergy shots to see how she does. She's had no recurrence of her dysphasia where she was evaluated in the fall of 2012. Continues to use her CPAP machine but recently feels awake difficulty sleeping. She is taking the Lexapro 5 mg every other day instead of every day. She is now taking it at night No change in her blood pressure medicine.    Hearing: Good  Vision:  No limitations at present . Only wears reader glasses since her cataract surgery  Safety:  Has smoke detector and wears seat belts.  No firearms. No excess sun exposure. Sees dentist regularly.  Falls: None  Advance directive :  Reviewed  he'll doesn't have one but is talked about it with a lawyer  Memory: Felt to be good  , no concern from her or her family.  Depression: No anhedonia unusual crying or depressive symptoms  Nutrition: Eats well balanced diet; adequate calcium and vitamin D. No current swallowing chewiing problems.  Injury: no major injuries in the last six months.  Other healthcare providers:  Reviewed today .  Social:  Live alone no pets  Preventive parameters: up-to-date on  except for Zostavax in the past her insurance didn't cover it.   ADLS:   There are no problems or need for assistance  driving, feeding, obtaining food, dressing, toileting and bathing, managing money using phone. She is independent. She does get someone to help her balance her checkbook.  Exercise  some  walking 15 minutes at a time at least twice a week. No tobacco  etoh    Review of Systems ROS As per history of present illness no bleeding swelling depression or panic attacks  Outpatient Encounter Prescriptions as of 01/02/2012  Medication Sig Dispense Refill  . aspirin 81 MG tablet Take 81 mg by mouth daily.        Marland Kitchen atorvastatin (LIPITOR) 20 MG tablet Take 20 mg by mouth. Take 1/2 tab by mouth once daily.       . cholecalciferol (VITAMIN D-400) 400 UNIT TABS Take 400 Units by mouth daily.        Marland Kitchen escitalopram (LEXAPRO) 10 MG tablet Take 10 mg by mouth. Take 1/2 tab by mouth every other day for a week and then stop or use as needed       . losartan (COZAAR) 50 MG tablet Take 50 mg by mouth daily.       . Multiple Vitamin (MULTIVITAMIN) capsule        . NON FORMULARY Allergy Vaccine.  As directed per Dr. [name]       . NON FORMULARY CPAP machine.  Use as directed at bedtime.       Marland Kitchen omeprazole (PRILOSEC OTC) 20 MG tablet 20 mg.         Past history family history social history reviewed in the electronic medical record.     Objective:   Physical Exam BP  130/80  Pulse 60  Temp(Src) 98 F (36.7 C) (Oral)  Ht 5' 2.5" (1.588 m)  Wt 157 lb (71.215 kg)  BMI 28.26 kg/m2  SpO2 97%  Wt Readings from Last 3 Encounters:  01/02/12 157 lb (71.215 kg)  07/27/11 159 lb (72.122 kg)  06/14/11 156 lb (70.761 kg)   Physical Exam: Vital signs reviewed ZOX:WRUE is a well-developed well-nourished alert cooperative  AA  female who appears her stated age  Or younger  Well groomed in no acute distress.  HEENT: normocephalic atraumatic , Eyes: PERRL EOM's full, conjunctiva clear, Nares: paten,t no deformity discharge or tenderness., Ears: no deformity Mouth: clear OP, no lesions, edema.  Moist mucous membranes. Dentition adequate NECK: supple without masses, or bruits. CHEST/PULM:  Clear to auscultation and percussion breath sounds equal no wheeze , rales or rhonchi. No chest wall  deformities or tenderness. CV: PMI is nondisplaced, S1 S2 no gallops, murmurs, rubs. Peripheral pulses are full without delay.No JVD .  Repeat bp 140/70 ABDOMEN: Bowel sounds normal nontender  No guard or rebound, no hepato splenomegal no CVA tenderness.   Extremtities:  No clubbing cyanosis or edema, no acute joint swelling or redness no focal atrophy NEURO:  Oriented x3, cranial nerves 3-12 appear to be intact, no obvious focal weakness,gait within normal limits no abnormal reflexes or asymmetrical SKIN: No acute rashes normal turgor, color, no bruising or petechiae. : Oriented, good eye contact, no obvious depression anxiety, cognition and judgment appear normal. LN: no cervical  adenopathy      Assessment & Plan:  Preventive Health Care Counseled regarding healthy nutrition, exercise, sleep, injury prevention, calcium vit d and healthy weight .to check into cost of zostavax.   Episodic dizziness or lightheadedness unclear etiology she's had this for quite a while does not seem to be hypotension or arrhythmia. Still wonder if medications could be involved especially with her fatigue. Discussed the Lexapro and a trial off of weaning off to see if it makes any difference  without deterioration of her mood. We'll do laboratory tests today rule out metabolic. Her physical exam looks good today and she otherwise appears healthy. She's been trying to lose weight for her health. GERD currently stable Hyperlipidemia on 10 mg of Lipitor Hypertension appears to be controlled history of side effects of diuretics in the past Allergy off shots and see how she does  In remission? ANemia chronic  Check today   (See past advice from cardiology  Dr Johney Frame in the past when eval for syncope and  Ortho statis.  Problem # 1: SYNCOPE (ICD-780.2)  The patient recently had an episode of syncope. By history, this is orthostatic syncope. She is documented to be orthostatic in the office today as well. I have  recommended adequate hydration. She should also consider support hose.  I have advised her of DMV's recommendation that she not drive for 6 months after syncope. I have reviewed her Echo results which reveal no significant structural abnormalities. Given nl EF and no syptoms of ischemia, no further cardiac testing is planned at this time.  Problem # 2: HYPERTENSION (ICD-401.9)  stable, given recent orthostatic syncope, I would avoid diuretics or aggressive BP control at this time.  Her updated medication list for this problem includes:  Bayer Aspirin 325 Mg Tabs (Aspirin) .Marland Kitchen... Take 1 tablet by mouth once a day  Cozaar 100 Mg Tabs (Losartan potassium) .Marland Kitchen... Take 1/2 tablet by mouth once a day  Norvasc 5 Mg Tabs (Amlodipine besylate) .Marland Kitchen... 1/2  to 1 by mouth once daily )

## 2012-01-05 DIAGNOSIS — Z0000 Encounter for general adult medical examination without abnormal findings: Secondary | ICD-10-CM | POA: Insufficient documentation

## 2012-01-09 ENCOUNTER — Other Ambulatory Visit: Payer: Self-pay | Admitting: Internal Medicine

## 2012-01-16 ENCOUNTER — Other Ambulatory Visit: Payer: Self-pay | Admitting: Family Medicine

## 2012-01-16 DIAGNOSIS — R7989 Other specified abnormal findings of blood chemistry: Secondary | ICD-10-CM

## 2012-01-16 DIAGNOSIS — R8290 Unspecified abnormal findings in urine: Secondary | ICD-10-CM

## 2012-01-17 ENCOUNTER — Other Ambulatory Visit (INDEPENDENT_AMBULATORY_CARE_PROVIDER_SITE_OTHER): Payer: Medicare Other

## 2012-01-17 DIAGNOSIS — R946 Abnormal results of thyroid function studies: Secondary | ICD-10-CM

## 2012-01-17 DIAGNOSIS — R8290 Unspecified abnormal findings in urine: Secondary | ICD-10-CM

## 2012-01-17 DIAGNOSIS — R82998 Other abnormal findings in urine: Secondary | ICD-10-CM | POA: Diagnosis not present

## 2012-01-17 DIAGNOSIS — R7989 Other specified abnormal findings of blood chemistry: Secondary | ICD-10-CM

## 2012-01-17 LAB — POCT URINALYSIS DIPSTICK
Bilirubin, UA: NEGATIVE
Ketones, UA: NEGATIVE
Nitrite, UA: NEGATIVE
Protein, UA: NEGATIVE

## 2012-01-17 LAB — T4, FREE: Free T4: 0.54 ng/dL — ABNORMAL LOW (ref 0.60–1.60)

## 2012-01-17 LAB — TSH: TSH: 1.64 u[IU]/mL (ref 0.35–5.50)

## 2012-01-23 ENCOUNTER — Other Ambulatory Visit: Payer: Self-pay | Admitting: Family Medicine

## 2012-01-23 DIAGNOSIS — R7989 Other specified abnormal findings of blood chemistry: Secondary | ICD-10-CM

## 2012-02-20 ENCOUNTER — Ambulatory Visit (INDEPENDENT_AMBULATORY_CARE_PROVIDER_SITE_OTHER): Payer: Medicare Other | Admitting: Endocrinology

## 2012-02-20 ENCOUNTER — Encounter: Payer: Self-pay | Admitting: Endocrinology

## 2012-02-20 VITALS — BP 158/88 | HR 67 | Temp 97.9°F | Ht 62.5 in | Wt 158.0 lb

## 2012-02-20 DIAGNOSIS — E042 Nontoxic multinodular goiter: Secondary | ICD-10-CM | POA: Diagnosis not present

## 2012-02-20 NOTE — Progress Notes (Signed)
Subjective:    Patient ID: Cassandra English, female    DOB: [birth year], 78 y.o.   MRN: [id number]  HPI Pt returns for f/u of very small (predominantly cystic) multinodular goiter, which was incidentally noted on carotid ultrasound in 2011.  pt states she has a productive cough.  She says she has had dilatation of an esophageal stricture in the past.   Past Medical History  Diagnosis Date  . HTN (hypertension)   . Hyperlipidemia   . Leukopenia     chronic. hx of hem consult years ago  . Hypersomnia with sleep apnea     on CPAP  . Asthma   . GERD (gastroesophageal reflux disease)     EGD 10 99 and 7 00  . ALLERGIC RHINITIS   . Diverticulitis      1 perf colon 10 99  . Anxiety   . Anemia     with borderline low wbc and neg heme eval 10 years ago.   . Syncope 2010    echo ok. cards consult    . Multinodular goiter 2011    get yearly [place]  . Cataract     Past Surgical History  Procedure Date  . Total abdominal hysterectomy   . Cataract extraction     History   Social History  . Marital Status: Divorced    Spouse Name: N/A    Number of Children: 3  . Years of Education: N/A   Occupational History  . Retired     Social History Main Topics  . Smoking status: Never Smoker   . Smokeless tobacco: Never Used  . Alcohol Use: No  . Drug Use: No  . Sexually Active: Not on file   Other Topics Concern  . Not on file   Social History Narrative   Lives alone.[address] Area Divorced 3 sonsDesignated Party Release signed on 0 caffeine  Drinks daily Brother died recently age 31s    Current Outpatient Prescriptions on File Prior to Visit  Medication Sig Dispense Refill  . aspirin 81 MG tablet Take 81 mg by mouth daily.        Marland Kitchen atorvastatin (LIPITOR) 10 MG tablet TAKE ONE TABLET BY MOUTH EVERY DAY  30 tablet  5  . cholecalciferol (VITAMIN D-400) 400 UNIT TABS Take 400 Units by mouth daily.        Marland Kitchen escitalopram (LEXAPRO) 10 MG tablet Take 0.5 tablets (5 mg total)  by mouth as directed. Take 1/2 tab by mouth every other day for a week and then every  3 days and then every 4 for a week then stop as directed      . losartan (COZAAR) 50 MG tablet Take 50 mg by mouth daily.       . Multiple Vitamin (MULTIVITAMIN) capsule        . NON FORMULARY Allergy Vaccine.  As directed per Dr. [name]       . NON FORMULARY CPAP machine.  Use as directed at bedtime.       Marland Kitchen omeprazole (PRILOSEC OTC) 20 MG tablet 20 mg.          Allergies  Allergen Reactions  . Azithromycin   . Propoxyphene-Acetaminophen   . Simvastatin     REACTION: unspecified    Family History  Problem Relation Age of Onset  . Coronary artery disease Mother   . Coronary artery disease Father   . Stroke Sister   . Colon cancer Neg Hx  BP 158/88  Pulse 67  Temp 97.9 F (36.6 C) (Oral)  Ht 5' 2.5" (1.588 m)  Wt 158 lb (71.668 kg)  BMI 28.44 kg/m2  SpO2 97%  Review of Systems She has fatigue.      Objective:   Physical Exam VITAL SIGNS:  See vs page GENERAL: no distress NECK: There is no palpable thyroid enlargement.  No thyroid nodule is palpable.  No palpable lymphadenopathy at the anterior neck.   Lab Results  Component Value Date   TSH 1.64 01/17/2012      Assessment & Plan:  multinodular goiter, small.  Clinically unchanged.

## 2012-02-20 NOTE — Patient Instructions (Addendum)
Let's recheck the ultrasound.  you will receive a phone call, about a day and time for an appointment.  You will receive a letter with results.

## 2012-02-26 ENCOUNTER — Encounter: Payer: Self-pay | Admitting: Endocrinology

## 2012-02-26 ENCOUNTER — Ambulatory Visit (HOSPITAL_COMMUNITY)
Admission: RE | Admit: 2012-02-26 | Discharge: 2012-02-26 | Disposition: A | Discharge status: Home / Self Care | Payer: Medicare Other | Source: Ambulatory Visit | Attending: Endocrinology | Admitting: Endocrinology

## 2012-02-26 DIAGNOSIS — E042 Nontoxic multinodular goiter: Secondary | ICD-10-CM | POA: Diagnosis not present

## 2012-02-27 ENCOUNTER — Telehealth: Payer: Self-pay | Admitting: *Deleted

## 2012-02-27 NOTE — Telephone Encounter (Signed)
Called pt to inform of thyroid U/S, pt informed (letter also mailed to pt).

## 2012-04-03 ENCOUNTER — Ambulatory Visit (INDEPENDENT_AMBULATORY_CARE_PROVIDER_SITE_OTHER): Payer: Medicare Other | Admitting: Internal Medicine

## 2012-04-03 ENCOUNTER — Encounter: Payer: Self-pay | Admitting: Internal Medicine

## 2012-04-03 VITALS — BP 144/72 | HR 75 | Temp 98.0°F | Wt 158.0 lb

## 2012-04-03 DIAGNOSIS — Z23 Encounter for immunization: Secondary | ICD-10-CM

## 2012-04-03 DIAGNOSIS — G4710 Hypersomnia, unspecified: Secondary | ICD-10-CM | POA: Diagnosis not present

## 2012-04-03 DIAGNOSIS — E785 Hyperlipidemia, unspecified: Secondary | ICD-10-CM

## 2012-04-03 DIAGNOSIS — I10 Essential (primary) hypertension: Secondary | ICD-10-CM | POA: Diagnosis not present

## 2012-04-03 DIAGNOSIS — Z87440 Personal history of urinary (tract) infections: Secondary | ICD-10-CM

## 2012-04-03 DIAGNOSIS — R5381 Other malaise: Secondary | ICD-10-CM

## 2012-04-03 DIAGNOSIS — K219 Gastro-esophageal reflux disease without esophagitis: Secondary | ICD-10-CM

## 2012-04-03 DIAGNOSIS — R7989 Other specified abnormal findings of blood chemistry: Secondary | ICD-10-CM

## 2012-04-03 DIAGNOSIS — E042 Nontoxic multinodular goiter: Secondary | ICD-10-CM

## 2012-04-03 DIAGNOSIS — R5383 Other fatigue: Secondary | ICD-10-CM

## 2012-04-03 DIAGNOSIS — R946 Abnormal results of thyroid function studies: Secondary | ICD-10-CM

## 2012-04-03 LAB — POCT URINALYSIS DIPSTICK
Bilirubin, UA: NEGATIVE
Glucose, UA: NEGATIVE
Nitrite, UA: NEGATIVE
pH, UA: 5.5

## 2012-04-03 LAB — T3, FREE: T3, Free: 2.3 pg/mL (ref 2.3–4.2)

## 2012-04-03 LAB — TSH: TSH: 1.54 u[IU]/mL (ref 0.35–5.50)

## 2012-04-03 NOTE — Patient Instructions (Signed)
Uncertain why you are so tired.  Our thyroid  Tests should be repeated  To see what this is doing.  Although it probably isnt related  We can try off of the lipitor and see if over a months you feel better.   Your blood pressure   Is good.   Will get youback with sleep specialist.   Plan follow up depending on labs .  Or 3 months

## 2012-04-03 NOTE — Progress Notes (Signed)
Subjective:    Patient ID: Cassandra English, female    DOB: 1926/11/02, 78 y.o.   MRN: [id number]  HPI Patient comes in today for follow up of  multiple medical problems.  Feel real dixzzy  And tired  sleepy all the time   Saw Dr E about the thyroid and felt to have mng and check in 2 years  Didn't address the fatigue .  And thyroid  test not rechecked . Had cpap machine and no longer seems to help as much as in the past.  Thinks bp is good .  Is very concerned about her sleepiness. Review of Systems No cp sob syncope bleeding vision hearing change  claudication and myalgias. No uti sx  Rest as per hpu   Past history family history social history reviewed in the electronic medical record.  Outpatient Encounter Prescriptions as of 04/03/2012  Medication Sig Dispense Refill  . aspirin 81 MG tablet Take 81 mg by mouth daily.        Marland Kitchen atorvastatin (LIPITOR) 10 MG tablet TAKE ONE TABLET BY MOUTH EVERY DAY  30 tablet  5  . cholecalciferol (VITAMIN D-400) 400 UNIT TABS Take 400 Units by mouth daily.        Marland Kitchen losartan (COZAAR) 50 MG tablet Take 50 mg by mouth daily.       . Multiple Vitamin (MULTIVITAMIN) capsule        . NON FORMULARY Allergy Vaccine.  As directed per Dr. [name]       . NON FORMULARY CPAP machine.  Use as directed at bedtime.       Marland Kitchen omeprazole (PRILOSEC OTC) 20 MG tablet 20 mg.        . DISCONTD: escitalopram (LEXAPRO) 10 MG tablet Take 0.5 tablets (5 mg total) by mouth as directed. Take 1/2 tab by mouth every other day for a week and then every  3 days and then every 4 for a week then stop as directed           Objective:   Physical Exam BP 144/72  Pulse 75  Temp 98 F (36.7 C) (Oral)  Wt 158 lb (71.668 kg)  SpO2 99% Wt Readings from Last 3 Encounters:  04/03/12 158 lb (71.668 kg)  02/20/12 158 lb (71.668 kg)  01/02/12 157 lb (71.215 kg)  WDWN in nad HEENT:  nl grossly  Neck: Supple without adenopathy or masses or bruits Chest:  Clear to A&P without  wheezes rales or rhonchi CV:  S1-S2 no gallops   rr  peripheral perfusion is normal repeat bp confirmed 130 - 140 range not orthostatic   Abdomen:  Sof,t normal bowel sounds without hepatosplenomegaly, no guarding rebound or masses no CVA tenderness No clubbing cyanosis or edema Neuro Oriented x 3 and no noted deficits in memory, attention, and speech. No rigitiy or tremor.  Non focal  Exam noted      Assessment & Plan:  HT stable    Checked today  Not particularly orthostatic  taking 25 mg  Per day   MNG  Abnormal thyroid tests   Saw Endo and advise to recheck in 2 years. Pt concerned thyroid a problem causing her sx.  Will repeat today .  CONTINUING FATIGUE Sleepiness. Possibly  Worse and problematic for her   She describes as  Sleepy    AndIn bed a lot .  Not really depressed per pt   Not exercising as much   .   Need to get  back with pulmonary  About her hypersomnia and any ideas about intervention.   LIPIDS  On meds for a while  Trial off of med to see if any improvement in sx .   GERD stable  Asthma stable fluu vaccine today

## 2012-04-05 DIAGNOSIS — R5383 Other fatigue: Secondary | ICD-10-CM | POA: Insufficient documentation

## 2012-04-09 ENCOUNTER — Other Ambulatory Visit: Payer: Self-pay | Admitting: Family Medicine

## 2012-04-09 DIAGNOSIS — N390 Urinary tract infection, site not specified: Secondary | ICD-10-CM

## 2012-04-10 ENCOUNTER — Other Ambulatory Visit (INDEPENDENT_AMBULATORY_CARE_PROVIDER_SITE_OTHER): Payer: Medicare Other

## 2012-04-10 DIAGNOSIS — N390 Urinary tract infection, site not specified: Secondary | ICD-10-CM | POA: Diagnosis not present

## 2012-04-10 LAB — POCT URINALYSIS DIPSTICK
Blood, UA: NEGATIVE
Ketones, UA: NEGATIVE
Protein, UA: NEGATIVE
pH, UA: 6

## 2012-04-14 ENCOUNTER — Encounter: Payer: Self-pay | Admitting: Family Medicine

## 2012-05-01 ENCOUNTER — Other Ambulatory Visit: Payer: Self-pay | Admitting: Family Medicine

## 2012-05-01 DIAGNOSIS — G4710 Hypersomnia, unspecified: Secondary | ICD-10-CM

## 2012-05-01 DIAGNOSIS — G4730 Sleep apnea, unspecified: Secondary | ICD-10-CM

## 2012-05-15 DIAGNOSIS — Z961 Presence of intraocular lens: Secondary | ICD-10-CM | POA: Diagnosis not present

## 2012-05-16 ENCOUNTER — Encounter: Payer: Self-pay | Admitting: Pulmonary Disease

## 2012-05-16 ENCOUNTER — Ambulatory Visit (INDEPENDENT_AMBULATORY_CARE_PROVIDER_SITE_OTHER): Payer: Medicare Other | Admitting: Pulmonary Disease

## 2012-05-16 VITALS — BP 132/78 | HR 66 | Temp 98.1°F | Ht 63.0 in | Wt 159.0 lb

## 2012-05-16 DIAGNOSIS — G4710 Hypersomnia, unspecified: Secondary | ICD-10-CM | POA: Diagnosis not present

## 2012-05-16 NOTE — Assessment & Plan Note (Addendum)
We will ask DME to check out your machine pressure We will have them get you a new full face mask Call me if you are still having problems after these changes  I do think she was slightly better with cpap - however if she is unable to tolerate then we may have to discontinue - I doubt she is a good candidate for oral appliance

## 2012-05-16 NOTE — Progress Notes (Signed)
  Subjective:    Patient ID: Cassandra English, female    DOB: 11-Feb-1927, 78 y.o.   MRN: [id number]  HPI Primary Provider - Madelin Headings MD    85/F for FU of mild obstructive sleep apnea -improved with CPAP.  PSG >> AHI 10.9, predominant hypopneas, REM 17/h  CPAP download 08/04/07- 2/13 >> good compliance, avg pressure 10 cm on auto 5-15  5/10 Agrees that CPAP benefits incl. less sleepinesss, more energy.  reviewed download 1/5 to 09/12/07 , 65% comlpiance, cpap PR + 9.8   October 25, 2009  Had a spell in 9/10 of high BP/ dizziness, stopped driving, asked to drink a lot of water & wear support hose, wakes up with nocturia so frequently that CPAP use has been difficult.  bedtime 10p , BR visit 1 am --> sometimes leaves CPAP off. c/o allergies that make it difficult to use CPAP - Dr Corinda Gubler gave her some meds. c/o mask indentation on face.  When she uses it, she 'sleeps real good' & wakes up refreshed. Would like to keep using it.  September 28, 2010 Stopped using CPAP after eye surgery, also had issues with DME ([facility]) - wrong mask -had to return , now agreeable to resume using  05/16/2012 18 m FU  last seen 09/28/2010. pt has not worn cpap x 2 months. it causes her mouth to be dry and was uncomfortable. she could not sleep with it on. would be awake until 1-2 am Med review does not reveal any culprits   Review of Systems neg for any significant sore throat, dysphagia, itching, sneezing, nasal congestion or excess/ purulent secretions, fever, chills, sweats, unintended wt loss, pleuritic or exertional cp, hempoptysis, orthopnea pnd or change in chronic leg swelling. Also denies presyncope, palpitations, heartburn, abdominal pain, nausea, vomiting, diarrhea or change in bowel or urinary habits, dysuria,hematuria, rash, arthralgias, visual complaints, headache, numbness weakness or ataxia.     Objective:   Physical Exam  Gen. Pleasant, well-nourished, in no distress ENT - no lesions, no  post nasal drip Neck: No JVD, no thyromegaly, no carotid bruits Lungs: no use of accessory muscles, no dullness to percussion, clear without rales or rhonchi  Cardiovascular: Rhythm regular, heart sounds  normal, no murmurs or gallops, no peripheral edema Musculoskeletal: No deformities, no cyanosis or clubbing        Assessment & Plan:

## 2012-05-16 NOTE — Patient Instructions (Addendum)
We will ask DME to check out your machine pressure We will have them get you a new full face mask Call me if you are still having problems after these changes

## 2012-05-27 ENCOUNTER — Other Ambulatory Visit: Payer: Self-pay | Admitting: Internal Medicine

## 2012-05-28 ENCOUNTER — Telehealth: Payer: Self-pay | Admitting: Pulmonary Disease

## 2012-05-28 DIAGNOSIS — G4733 Obstructive sleep apnea (adult) (pediatric): Secondary | ICD-10-CM

## 2012-05-28 NOTE — Telephone Encounter (Signed)
Spoke with patient, patient states that she took her machine to have it "looked at" since she had been having problems with it.  Patient states she was told by [facility] that she could get a new machine just needs a Rx sent to them.  I have called [facility] and talked to rep that states patient received machine from [facility] in Jan. 2009 so patient is no eligible to receive a new machine.  Patient needs to have an order placed stating CPAP machine is broke and needs repair, and from that point [facility] will schedule a visit to look at and/or fix machine.  I have spoke with patient and informed her of what is actually going on. Verbalized understanding of this.  I have placed this order to DME for the repair ticket process to start.  At this time nothing further needed

## 2012-06-02 DIAGNOSIS — Z0000 Encounter for general adult medical examination without abnormal findings: Secondary | ICD-10-CM | POA: Diagnosis not present

## 2012-06-02 DIAGNOSIS — R5383 Other fatigue: Secondary | ICD-10-CM | POA: Diagnosis not present

## 2012-06-02 DIAGNOSIS — I10 Essential (primary) hypertension: Secondary | ICD-10-CM | POA: Diagnosis not present

## 2012-06-02 DIAGNOSIS — E785 Hyperlipidemia, unspecified: Secondary | ICD-10-CM | POA: Diagnosis not present

## 2012-06-02 DIAGNOSIS — E559 Vitamin D deficiency, unspecified: Secondary | ICD-10-CM | POA: Diagnosis not present

## 2012-06-02 DIAGNOSIS — D649 Anemia, unspecified: Secondary | ICD-10-CM | POA: Diagnosis not present

## 2012-06-02 DIAGNOSIS — R5381 Other malaise: Secondary | ICD-10-CM | POA: Diagnosis not present

## 2012-06-02 DIAGNOSIS — R82998 Other abnormal findings in urine: Secondary | ICD-10-CM | POA: Diagnosis not present

## 2012-06-30 DIAGNOSIS — Z1231 Encounter for screening mammogram for malignant neoplasm of breast: Secondary | ICD-10-CM | POA: Diagnosis not present

## 2012-07-03 ENCOUNTER — Ambulatory Visit: Payer: Medicare Other | Admitting: Internal Medicine

## 2012-08-01 DIAGNOSIS — Z23 Encounter for immunization: Secondary | ICD-10-CM | POA: Diagnosis not present

## 2012-10-22 DIAGNOSIS — I10 Essential (primary) hypertension: Secondary | ICD-10-CM | POA: Diagnosis not present

## 2012-10-22 DIAGNOSIS — E785 Hyperlipidemia, unspecified: Secondary | ICD-10-CM | POA: Diagnosis not present

## 2012-10-22 DIAGNOSIS — K219 Gastro-esophageal reflux disease without esophagitis: Secondary | ICD-10-CM | POA: Diagnosis not present

## 2012-11-10 ENCOUNTER — Encounter: Payer: Self-pay | Admitting: Podiatry

## 2012-11-10 ENCOUNTER — Ambulatory Visit (INDEPENDENT_AMBULATORY_CARE_PROVIDER_SITE_OTHER): Payer: Medicare Other | Admitting: Podiatry

## 2012-11-10 VITALS — BP 126/71 | HR 70 | Ht 63.0 in | Wt 154.0 lb

## 2012-11-10 DIAGNOSIS — B351 Tinea unguium: Secondary | ICD-10-CM | POA: Diagnosis not present

## 2012-11-10 DIAGNOSIS — L84 Corns and callosities: Secondary | ICD-10-CM

## 2012-11-10 DIAGNOSIS — M25579 Pain in unspecified ankle and joints of unspecified foot: Secondary | ICD-10-CM

## 2012-11-10 NOTE — Progress Notes (Signed)
Subjective: 79 y.o. year old female patient presents complaining of painful calluses and nails. Patient requests toe nails and calluses trimmed.  She has many contracted toes on both feet, but they do not bother her.  Review of Systems - General ROS: negative for - chills, fatigue, fever, night sweats, sleep disturbance, weight gain or weight loss  Objective: Dermatologic: Thick yellow deformed nails x 10.  Thick plantar calluses under 5th MPJ bilateral with brown discoloration from intra demal hemorrhage.  No open wound. No associated edema or erythema.  Vascular: Dorsalis Pedis Arteries palpable on both feet. Posterior Tibial Pulses are not palpable on both. Positive of mild ankle edema bilateral.  Orthopedic: Contracted lesser digits 3rd, 4th, and 5th right, and 4th and 5th left.  There is some limitation of ankle dorsiflexion with knee extension on right. The left side is normal. Neurologic: All epicritic and tactile sensations grossly intact. Positive response to Monofilament testing and Vibratory sensation bilateral. Ankle DTR reflexes normal.   Assessment: Dystrophic mycotic nails x 10. Pre-ulcerative plantar callus under 5th Metatarsophalangeal joint bilateral.  Treatment: All mycotic nails, corns, calluses debrided.  Return in 3 months or as needed.

## 2012-11-10 NOTE — Patient Instructions (Addendum)
Seen for toe nails and calluses. Both feet have very thick (stained with old blood within) callus under the 5th Metatarsophalangeal joint that could become ulcerated. Today all calluses and toe nails trimmed. Should return as soon as the callus become thick and gets painful. Otherwise return in 3 months.

## 2012-11-19 DIAGNOSIS — J301 Allergic rhinitis due to pollen: Secondary | ICD-10-CM | POA: Diagnosis not present

## 2012-11-19 DIAGNOSIS — J3089 Other allergic rhinitis: Secondary | ICD-10-CM | POA: Diagnosis not present

## 2012-12-26 IMAGING — US US SOFT TISSUE HEAD/NECK
1 series · 14 of 25 positions shown · non-contrast
Comparison: None.

CLINICAL DATA: Multinodular goiter.

THYROID ULTRASOUND
TECHNIQUE: Ultrasound examination of the thyroid gland and adjacent
soft tissues was performed.

[Series 1: us soft tissue head/neck · 0.07mm/px · 14 of 42 slices shown]
[im 1/42]
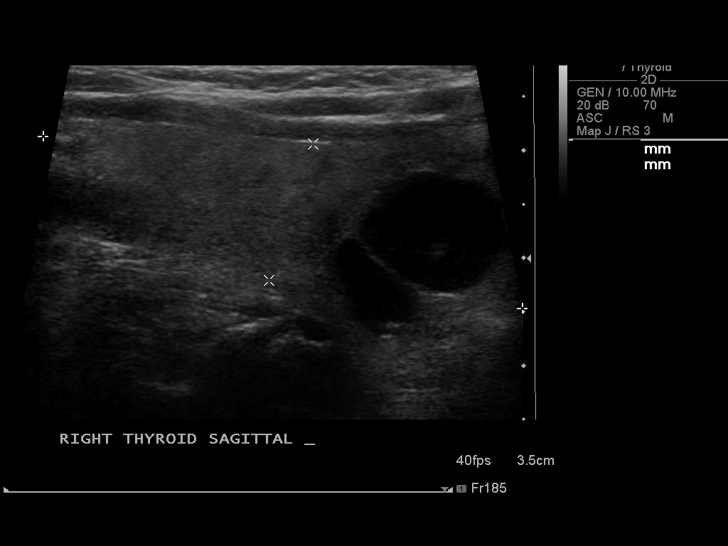
[im 4/42]
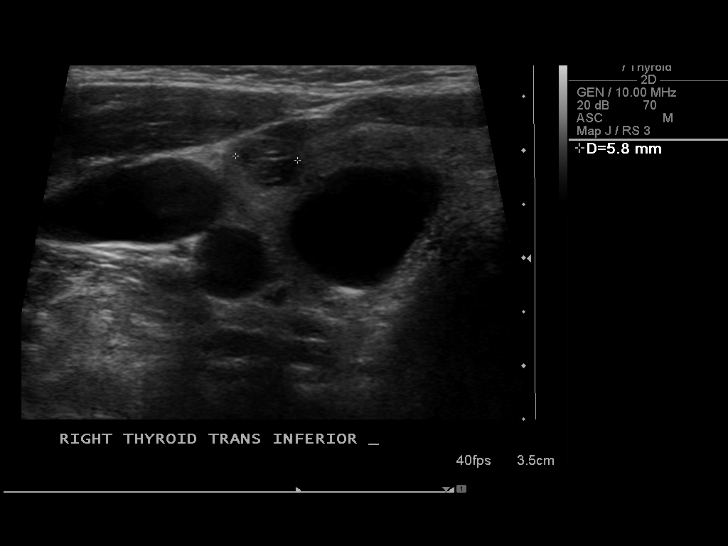
[im 7/42]
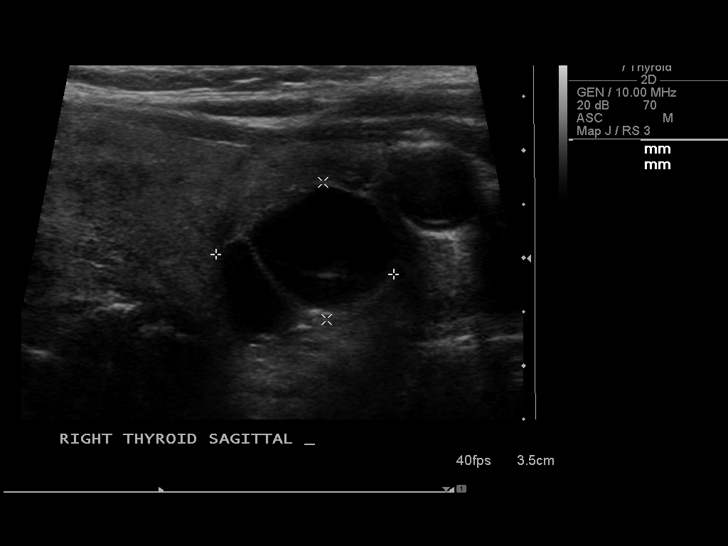
[im 11/42]
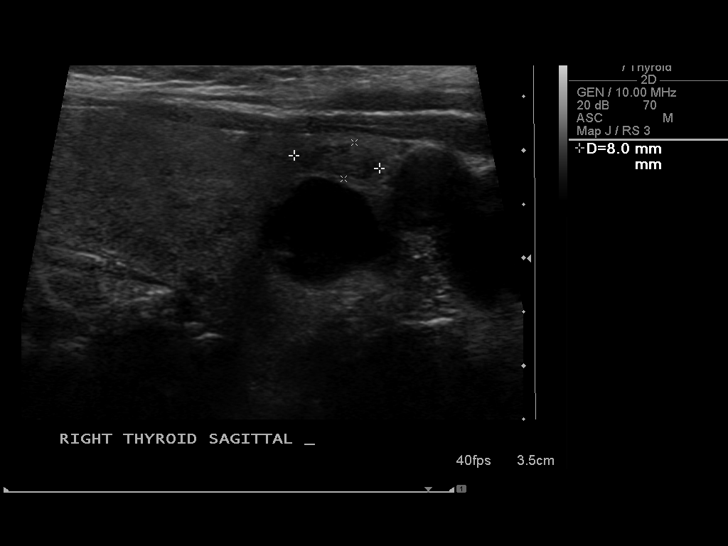
[im 14/42]
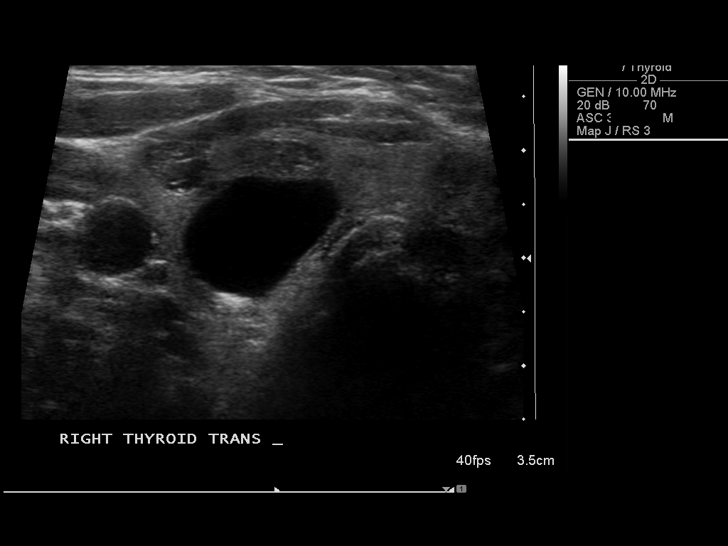
[im 16/42]
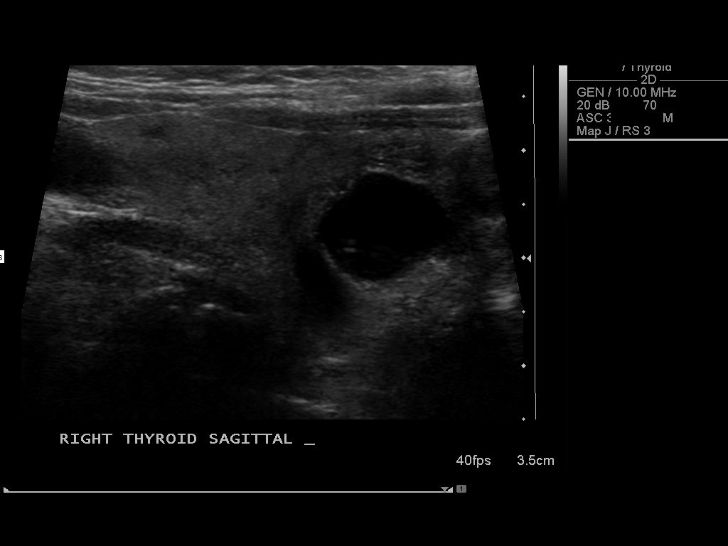
[im 19/42]
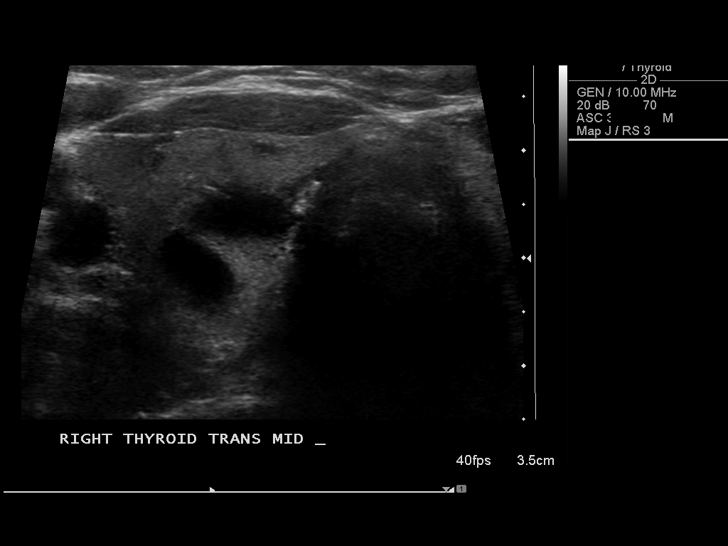
[im 23/42]
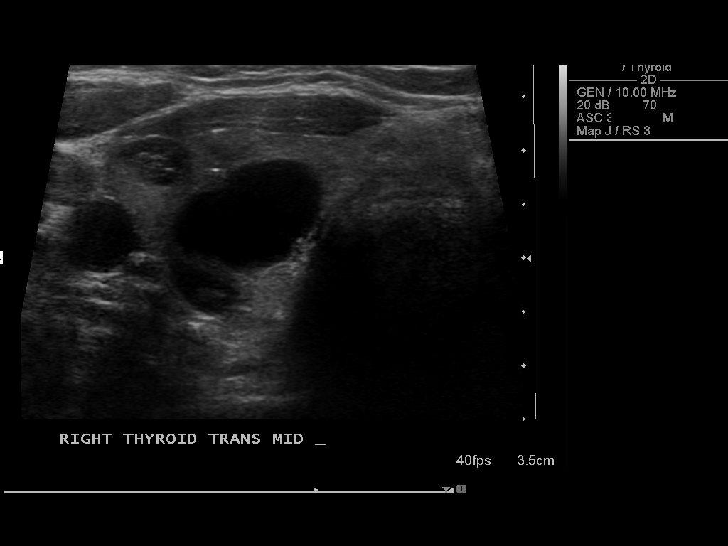
[im 26/42]
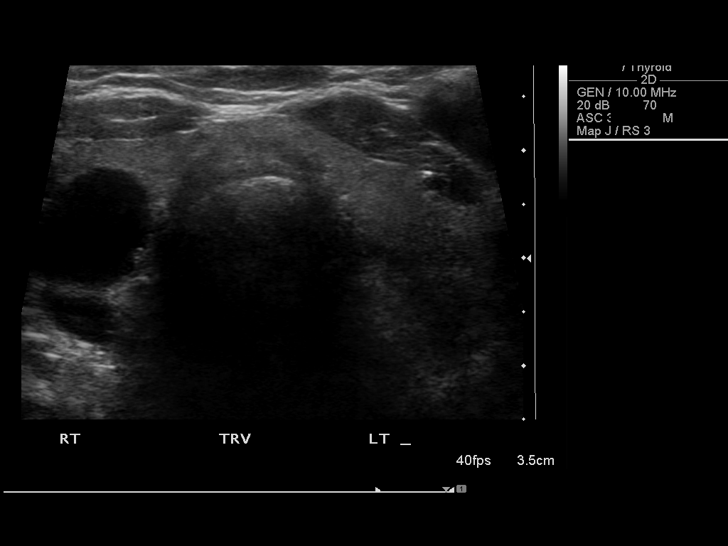
[im 28/42]
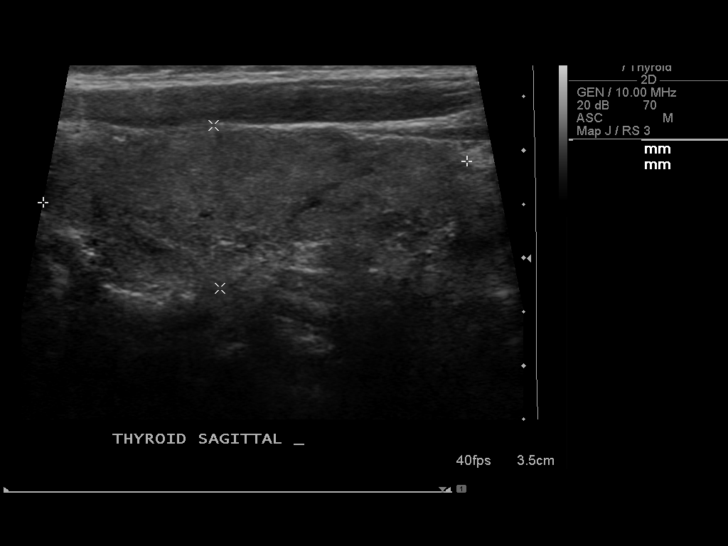
[im 31/42]
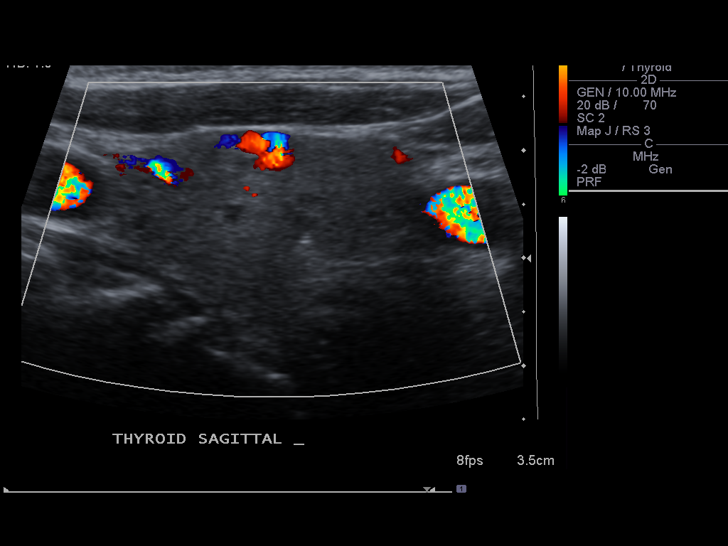
[im 35/42]
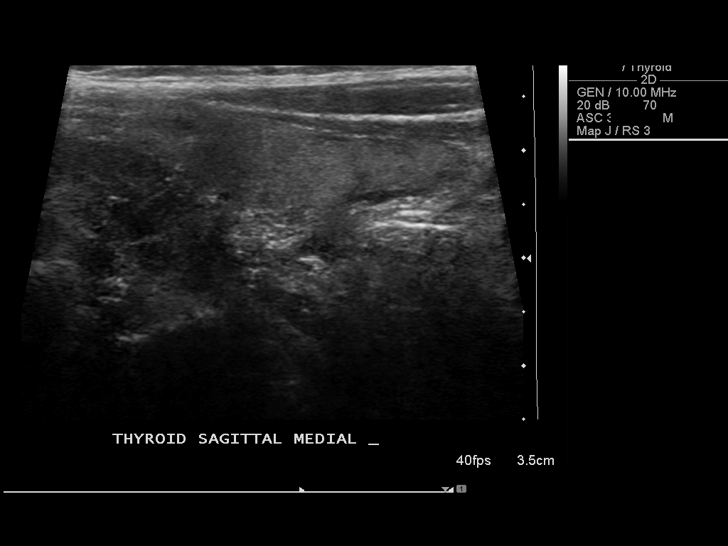
[im 38/42]
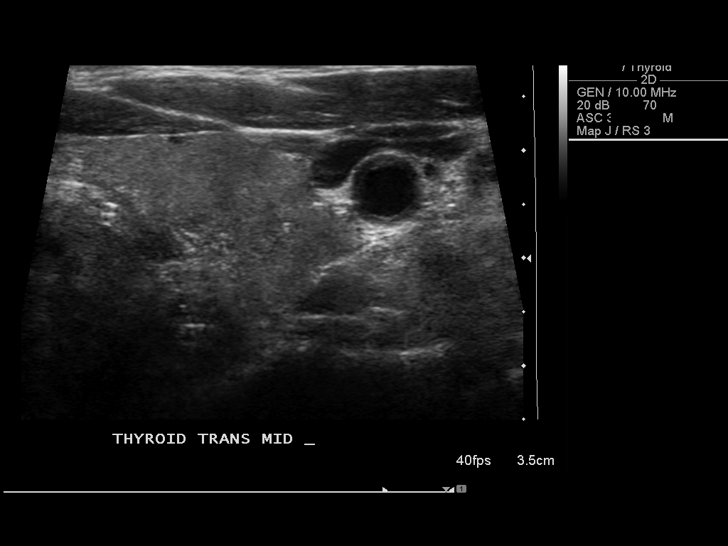
[im 42/42]
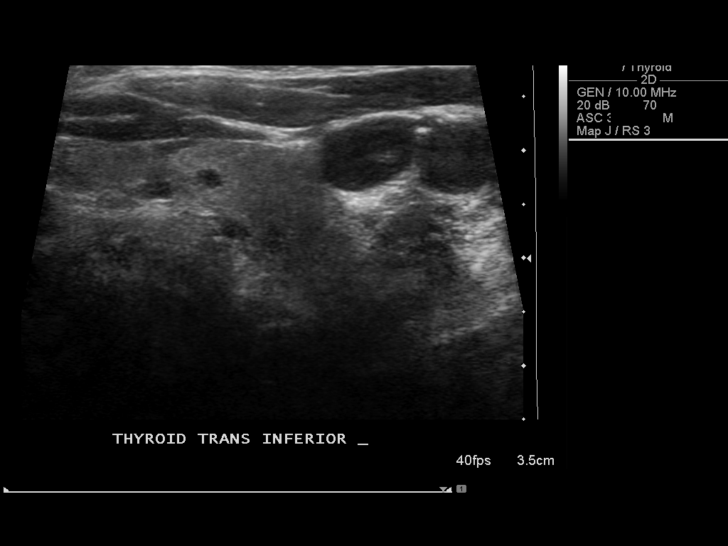

[14 of 25 positions shown; findings below may reference images not displayed]

FINDINGS: Right thyroid lobe:  4.7 x 1.3 x 2.2 cm, heterogeneous.
Left thyroid lobe:  4.0 x 1.5 x 1.7 cm, heterogeneous.
Isthmus:  5 mm.

Focal nodules:  A cystic nodule with internal septation is seen in
the midpole of the right thyroid (image 8).  It has posterior
acoustic enhancement and no internal solid features.

Additional hypoechoic nodules measure 8 mm or less in size
bilaterally.

Lymphadenopathy:  None visualized.
IMPRESSION: Scattered cystic and hypoechoic nodules bilaterally.  No worrisome
findings.

## 2013-01-03 DIAGNOSIS — R8290 Unspecified abnormal findings in urine: Secondary | ICD-10-CM | POA: Insufficient documentation

## 2013-01-20 DIAGNOSIS — H10439 Chronic follicular conjunctivitis, unspecified eye: Secondary | ICD-10-CM | POA: Diagnosis not present

## 2013-02-09 ENCOUNTER — Ambulatory Visit: Payer: Medicare Other | Admitting: Podiatry

## 2013-04-03 DIAGNOSIS — K219 Gastro-esophageal reflux disease without esophagitis: Secondary | ICD-10-CM | POA: Diagnosis not present

## 2013-04-03 DIAGNOSIS — I10 Essential (primary) hypertension: Secondary | ICD-10-CM | POA: Diagnosis not present

## 2013-04-03 DIAGNOSIS — R5381 Other malaise: Secondary | ICD-10-CM | POA: Diagnosis not present

## 2013-04-03 DIAGNOSIS — E785 Hyperlipidemia, unspecified: Secondary | ICD-10-CM | POA: Diagnosis not present

## 2013-04-03 DIAGNOSIS — E559 Vitamin D deficiency, unspecified: Secondary | ICD-10-CM | POA: Diagnosis not present

## 2013-04-14 DIAGNOSIS — F4321 Adjustment disorder with depressed mood: Secondary | ICD-10-CM | POA: Diagnosis not present

## 2013-04-14 DIAGNOSIS — R5381 Other malaise: Secondary | ICD-10-CM | POA: Diagnosis not present

## 2013-04-17 ENCOUNTER — Other Ambulatory Visit: Payer: Self-pay | Admitting: Orthopedic Surgery

## 2013-04-17 DIAGNOSIS — S63509A Unspecified sprain of unspecified wrist, initial encounter: Secondary | ICD-10-CM | POA: Diagnosis not present

## 2013-04-17 DIAGNOSIS — M25531 Pain in right wrist: Secondary | ICD-10-CM

## 2013-04-17 DIAGNOSIS — S62009A Unspecified fracture of navicular [scaphoid] bone of unspecified wrist, initial encounter for closed fracture: Secondary | ICD-10-CM | POA: Diagnosis not present

## 2013-04-17 DIAGNOSIS — D492 Neoplasm of unspecified behavior of bone, soft tissue, and skin: Secondary | ICD-10-CM | POA: Diagnosis not present

## 2013-04-22 ENCOUNTER — Ambulatory Visit
Admission: RE | Admit: 2013-04-22 | Discharge: 2013-04-22 | Disposition: A | Discharge status: Home / Self Care | Payer: Medicare Other | Source: Ambulatory Visit | Attending: Orthopedic Surgery | Admitting: Orthopedic Surgery

## 2013-04-22 DIAGNOSIS — S5290XA Unspecified fracture of unspecified forearm, initial encounter for closed fracture: Secondary | ICD-10-CM | POA: Diagnosis not present

## 2013-04-22 DIAGNOSIS — M25531 Pain in right wrist: Secondary | ICD-10-CM

## 2013-05-01 DIAGNOSIS — S63509A Unspecified sprain of unspecified wrist, initial encounter: Secondary | ICD-10-CM | POA: Diagnosis not present

## 2013-05-01 DIAGNOSIS — D492 Neoplasm of unspecified behavior of bone, soft tissue, and skin: Secondary | ICD-10-CM | POA: Diagnosis not present

## 2013-05-01 DIAGNOSIS — S62009A Unspecified fracture of navicular [scaphoid] bone of unspecified wrist, initial encounter for closed fracture: Secondary | ICD-10-CM | POA: Diagnosis not present

## 2013-05-19 ENCOUNTER — Encounter: Payer: Self-pay | Admitting: Pulmonary Disease

## 2013-05-19 ENCOUNTER — Ambulatory Visit (INDEPENDENT_AMBULATORY_CARE_PROVIDER_SITE_OTHER): Payer: Medicare Other | Admitting: Pulmonary Disease

## 2013-05-19 VITALS — BP 132/84 | HR 75 | Temp 98.0°F | Ht 63.0 in | Wt 149.6 lb

## 2013-05-19 DIAGNOSIS — G4733 Obstructive sleep apnea (adult) (pediatric): Secondary | ICD-10-CM

## 2013-05-19 NOTE — Assessment & Plan Note (Signed)
Appt with [facility] made for tomorrow  She does seem keen to get started back on the CPAP machine Reasonable to try this out some more. Reassess in if this is a viable option for her  Weight loss encouraged, compliance with goal of at least 4-6 hrs every night is the expectation. Advised against medications with sedative side effects Cautioned against driving when sleepy - understanding that sleepiness will vary on a day to day basis

## 2013-05-19 NOTE — Patient Instructions (Signed)
Appt with [facility] made for tomorrow  You have to get started back on the CPAP machine

## 2013-05-19 NOTE — Progress Notes (Signed)
  Subjective:    Patient ID: Cassandra English, female    DOB: 1926/11/02, 79 y.o.   MRN: [id number]  HPI  Primary Provider - Madelin Headings MD   79/F for FU of mild obstructive sleep apnea -improved with CPAP.  PSG >> AHI 10.9, predominant hypopneas, REM 17/h  CPAP download 08/04/07- 2/13 >> good compliance, avg pressure 10 cm on auto 5-15  download 1/5 to 09/12/07 , 65% comlpiance, cpap 10   05/19/2013 Annual FU  Chief Complaint  Patient presents with  . Follow-up    Not wearing CPAP for 2-3 months.  States took it to [facility] and they stated nothing wrong with machine but she can't get it to turn on   I suspect she has not worn x almost 1 yr. On last visit, we contacted [facility] to trouble shoot her cpap issues, but seems like she did not keep that appt. She still states that she would like to use her CPAP & this does help her   Review of Systems  neg for any significant sore throat, dysphagia, itching, sneezing, nasal congestion or excess/ purulent secretions, fever, chills, sweats, unintended wt loss, pleuritic or exertional cp, hempoptysis, orthopnea pnd or change in chronic leg swelling. Also denies presyncope, palpitations, heartburn, abdominal pain, nausea, vomiting, diarrhea or change in bowel or urinary habits, dysuria,hematuria, rash, arthralgias, visual complaints, headache, numbness weakness or ataxia.     Objective:   Physical Exam  Gen. Pleasant, well-nourished, in no distress ENT - no lesions, no post nasal drip Neck: No JVD, no thyromegaly, no carotid bruits Lungs: no use of accessory muscles, no dullness to percussion, clear without rales or rhonchi  Cardiovascular: Rhythm regular, heart sounds  normal, no murmurs or gallops, no peripheral edema Musculoskeletal: No deformities, no cyanosis or clubbing         Assessment & Plan:

## 2013-05-20 ENCOUNTER — Telehealth: Payer: Self-pay | Admitting: Pulmonary Disease

## 2013-05-20 NOTE — Telephone Encounter (Signed)
Pl FU with her in 1 week -have [facility] give [place] feedback.

## 2013-05-20 NOTE — Telephone Encounter (Signed)
I called [facility]. Spoke with Fayrene Fearing. Pt had an appt to check her machine today and pt called and cancelled the appt bc she was sick. She advised [facility] she will call back once she feels better. FYI for [place]

## 2013-05-20 NOTE — Telephone Encounter (Signed)
ATC [facility] and they are closed. Will call back tomorrow

## 2013-05-25 NOTE — Telephone Encounter (Signed)
I spoke with the pt and she states that she was sick when she was supposed to come in to [facility] and she states she called them and advised them of this. She states she is gong today. Carron Curie, CMA

## 2013-05-29 DIAGNOSIS — S63509A Unspecified sprain of unspecified wrist, initial encounter: Secondary | ICD-10-CM | POA: Diagnosis not present

## 2013-05-29 DIAGNOSIS — S62009A Unspecified fracture of navicular [scaphoid] bone of unspecified wrist, initial encounter for closed fracture: Secondary | ICD-10-CM | POA: Diagnosis not present

## 2013-05-29 DIAGNOSIS — D492 Neoplasm of unspecified behavior of bone, soft tissue, and skin: Secondary | ICD-10-CM | POA: Diagnosis not present

## 2013-06-04 DIAGNOSIS — G5600 Carpal tunnel syndrome, unspecified upper limb: Secondary | ICD-10-CM | POA: Diagnosis not present

## 2013-06-08 DIAGNOSIS — S63509A Unspecified sprain of unspecified wrist, initial encounter: Secondary | ICD-10-CM | POA: Diagnosis not present

## 2013-06-08 DIAGNOSIS — S62009A Unspecified fracture of navicular [scaphoid] bone of unspecified wrist, initial encounter for closed fracture: Secondary | ICD-10-CM | POA: Diagnosis not present

## 2013-06-08 DIAGNOSIS — G5600 Carpal tunnel syndrome, unspecified upper limb: Secondary | ICD-10-CM | POA: Diagnosis not present

## 2013-06-09 DIAGNOSIS — R5381 Other malaise: Secondary | ICD-10-CM | POA: Diagnosis not present

## 2013-06-09 DIAGNOSIS — S63509A Unspecified sprain of unspecified wrist, initial encounter: Secondary | ICD-10-CM | POA: Diagnosis not present

## 2013-06-09 DIAGNOSIS — I10 Essential (primary) hypertension: Secondary | ICD-10-CM | POA: Diagnosis not present

## 2013-06-09 DIAGNOSIS — G5600 Carpal tunnel syndrome, unspecified upper limb: Secondary | ICD-10-CM | POA: Diagnosis not present

## 2013-06-17 ENCOUNTER — Encounter: Payer: Self-pay | Admitting: Podiatry

## 2013-06-17 ENCOUNTER — Ambulatory Visit (INDEPENDENT_AMBULATORY_CARE_PROVIDER_SITE_OTHER): Payer: Medicare Other | Admitting: Podiatry

## 2013-06-17 VITALS — BP 131/73 | HR 76 | Ht 63.0 in | Wt 154.0 lb

## 2013-06-17 DIAGNOSIS — M79609 Pain in unspecified limb: Secondary | ICD-10-CM | POA: Diagnosis not present

## 2013-06-17 DIAGNOSIS — B351 Tinea unguium: Secondary | ICD-10-CM

## 2013-06-17 DIAGNOSIS — M79673 Pain in unspecified foot: Secondary | ICD-10-CM | POA: Insufficient documentation

## 2013-06-17 NOTE — Patient Instructions (Signed)
Seen for hypertrophic nails and calluses. All nails and calluses debrided. Return in 3 months or as needed.  

## 2013-06-17 NOTE — Progress Notes (Signed)
Subjective:  79 y.o. year old female patient presents complaining of painful calluses and nails. Patient requests toe nails and calluses trimmed.  Objective: Dermatologic: Thick yellow deformed nails x 10.  Thick plantar calluses under 5th MPJ bilateral with brown discoloration from intra demal hemorrhage.  No open wound. No associated edema or erythema.  Vascular: Dorsalis Pedis Arteries palpable on both feet. Posterior Tibial Pulses are not palpable on both.  Positive of mild ankle edema bilateral.  Orthopedic: Contracted lesser digits 3rd, 4th, and 5th right, and 4th and 5th left.  Neurologic: All epicritic and tactile sensations grossly intact.  Assessment:  Dystrophic mycotic nails x 10.  Pre-ulcerative plantar callus under 5th Metatarsophalangeal joint bilateral.  Treatment: All mycotic nails, corns, calluses debrided.  Return in 3 months or as needed.

## 2013-06-22 ENCOUNTER — Encounter (HOSPITAL_BASED_OUTPATIENT_CLINIC_OR_DEPARTMENT_OTHER): Payer: Self-pay | Admitting: *Deleted

## 2013-06-22 DIAGNOSIS — G5600 Carpal tunnel syndrome, unspecified upper limb: Secondary | ICD-10-CM | POA: Diagnosis not present

## 2013-06-22 NOTE — Progress Notes (Signed)
Pt says she drives-will come for [id number]-son to bring her for surgery Mild sleep apnea-has a cpap-tries to use it-takes it off.

## 2013-06-23 ENCOUNTER — Other Ambulatory Visit: Payer: Self-pay

## 2013-06-23 ENCOUNTER — Other Ambulatory Visit: Payer: Self-pay | Admitting: Orthopedic Surgery

## 2013-06-23 DIAGNOSIS — Z7982 Long term (current) use of aspirin: Secondary | ICD-10-CM | POA: Diagnosis not present

## 2013-06-23 DIAGNOSIS — G5600 Carpal tunnel syndrome, unspecified upper limb: Secondary | ICD-10-CM | POA: Diagnosis not present

## 2013-06-23 DIAGNOSIS — E785 Hyperlipidemia, unspecified: Secondary | ICD-10-CM | POA: Diagnosis not present

## 2013-06-23 DIAGNOSIS — I10 Essential (primary) hypertension: Secondary | ICD-10-CM | POA: Diagnosis not present

## 2013-06-23 DIAGNOSIS — G4710 Hypersomnia, unspecified: Secondary | ICD-10-CM | POA: Diagnosis not present

## 2013-06-23 LAB — BASIC METABOLIC PANEL
BUN: 13 mg/dL (ref 6–23)
CO2: 28 mEq/L (ref 19–32)
Chloride: 103 mEq/L (ref 96–112)
Potassium: 3.9 mEq/L (ref 3.5–5.1)
Sodium: 140 mEq/L (ref 135–145)

## 2013-06-29 ENCOUNTER — Ambulatory Visit (HOSPITAL_BASED_OUTPATIENT_CLINIC_OR_DEPARTMENT_OTHER): Payer: Medicare Other | Admitting: Anesthesiology

## 2013-06-29 ENCOUNTER — Encounter (HOSPITAL_BASED_OUTPATIENT_CLINIC_OR_DEPARTMENT_OTHER): Payer: Medicare Other | Admitting: Anesthesiology

## 2013-06-29 ENCOUNTER — Encounter (HOSPITAL_BASED_OUTPATIENT_CLINIC_OR_DEPARTMENT_OTHER)
Admission: RE | Disposition: A | Discharge status: Home / Self Care | Payer: Self-pay | Source: Ambulatory Visit | Attending: Orthopedic Surgery

## 2013-06-29 ENCOUNTER — Ambulatory Visit (HOSPITAL_BASED_OUTPATIENT_CLINIC_OR_DEPARTMENT_OTHER)
Admission: RE | Admit: 2013-06-29 | Discharge: 2013-06-29 | Disposition: A | Discharge status: Home / Self Care | Payer: Medicare Other | Source: Ambulatory Visit | Attending: Orthopedic Surgery | Admitting: Orthopedic Surgery

## 2013-06-29 ENCOUNTER — Encounter (HOSPITAL_BASED_OUTPATIENT_CLINIC_OR_DEPARTMENT_OTHER): Payer: Self-pay | Admitting: *Deleted

## 2013-06-29 DIAGNOSIS — E785 Hyperlipidemia, unspecified: Secondary | ICD-10-CM | POA: Diagnosis not present

## 2013-06-29 DIAGNOSIS — Z7982 Long term (current) use of aspirin: Secondary | ICD-10-CM | POA: Insufficient documentation

## 2013-06-29 DIAGNOSIS — G4710 Hypersomnia, unspecified: Secondary | ICD-10-CM | POA: Insufficient documentation

## 2013-06-29 DIAGNOSIS — I10 Essential (primary) hypertension: Secondary | ICD-10-CM | POA: Diagnosis not present

## 2013-06-29 DIAGNOSIS — K219 Gastro-esophageal reflux disease without esophagitis: Secondary | ICD-10-CM | POA: Diagnosis not present

## 2013-06-29 DIAGNOSIS — G5600 Carpal tunnel syndrome, unspecified upper limb: Secondary | ICD-10-CM | POA: Insufficient documentation

## 2013-06-29 DIAGNOSIS — J45909 Unspecified asthma, uncomplicated: Secondary | ICD-10-CM | POA: Diagnosis not present

## 2013-06-29 HISTORY — PX: CARPAL TUNNEL RELEASE: SHX101

## 2013-06-29 HISTORY — DX: Presence of spectacles and contact lenses: Z97.3

## 2013-06-29 LAB — POCT HEMOGLOBIN-HEMACUE: Hemoglobin: 10.6 g/dL — ABNORMAL LOW (ref 12.0–15.0)

## 2013-06-29 SURGERY — CARPAL TUNNEL RELEASE
Anesthesia: Monitor Anesthesia Care | Site: Hand | Laterality: Right | Wound class: Clean

## 2013-06-29 MED ORDER — PROPOFOL 10 MG/ML IV EMUL
INTRAVENOUS | Status: AC
  Filled 2013-06-29: qty 50

## 2013-06-29 MED ORDER — CEFAZOLIN SODIUM-DEXTROSE 2-3 GM-% IV SOLR: 2.0000 g | INTRAVENOUS | Status: DC

## 2013-06-29 MED ORDER — FENTANYL CITRATE 0.05 MG/ML IJ SOLN
50.0000 ug | INTRAMUSCULAR | Status: DC | PRN
  Administered 2013-06-29: 50 ug via INTRAVENOUS

## 2013-06-29 MED ORDER — FENTANYL CITRATE 0.05 MG/ML IJ SOLN: 25.0000 ug | INTRAMUSCULAR | Status: DC | PRN

## 2013-06-29 MED ORDER — HYDROCODONE-ACETAMINOPHEN 5-325 MG PO TABS: ORAL_TABLET | ORAL | Status: DC

## 2013-06-29 MED ORDER — LACTATED RINGERS IV SOLN
INTRAVENOUS | Status: DC
  Administered 2013-06-29: 09:00:00 via INTRAVENOUS

## 2013-06-29 MED ORDER — CHLORHEXIDINE GLUCONATE 4 % EX LIQD: 60.0000 mL | Freq: Once | CUTANEOUS | Status: DC

## 2013-06-29 MED ORDER — PROPOFOL 10 MG/ML IV BOLUS
INTRAVENOUS | Status: DC | PRN
  Administered 2013-06-29 (×2): 10 mg via INTRAVENOUS

## 2013-06-29 MED ORDER — BUPIVACAINE HCL (PF) 0.25 % IJ SOLN
INTRAMUSCULAR | Status: DC | PRN
  Administered 2013-06-29: 7 mL

## 2013-06-29 MED ORDER — LIDOCAINE HCL (PF) 0.5 % IJ SOLN
INTRAMUSCULAR | Status: DC | PRN
  Administered 2013-06-29: 30 mL via INTRAVENOUS

## 2013-06-29 MED ORDER — MIDAZOLAM HCL 2 MG/2ML IJ SOLN
INTRAMUSCULAR | Status: AC
  Filled 2013-06-29: qty 2

## 2013-06-29 MED ORDER — CEFAZOLIN SODIUM-DEXTROSE 2-3 GM-% IV SOLR
INTRAVENOUS | Status: AC
  Filled 2013-06-29: qty 50

## 2013-06-29 MED ORDER — FENTANYL CITRATE 0.05 MG/ML IJ SOLN
INTRAMUSCULAR | Status: AC
  Filled 2013-06-29: qty 6

## 2013-06-29 MED ORDER — MIDAZOLAM HCL 2 MG/2ML IJ SOLN: 1.0000 mg | INTRAMUSCULAR | Status: DC | PRN

## 2013-06-29 MED ORDER — ONDANSETRON HCL 4 MG/2ML IJ SOLN: 4.0000 mg | Freq: Once | INTRAMUSCULAR | Status: DC | PRN

## 2013-06-29 SURGICAL SUPPLY — 36 items
BANDAGE ELASTIC 3 VELCRO ST LF (GAUZE/BANDAGES/DRESSINGS) ×2 IMPLANT
BANDAGE GAUZE ELAST BULKY 4 IN (GAUZE/BANDAGES/DRESSINGS) ×2 IMPLANT
BLADE MINI RND TIP GREEN BEAV (BLADE) IMPLANT
BLADE SURG 15 STRL LF DISP TIS (BLADE) ×2 IMPLANT
BLADE SURG 15 STRL SS (BLADE) ×2
BNDG ESMARK 4X9 LF (GAUZE/BANDAGES/DRESSINGS) IMPLANT
CHLORAPREP W/TINT 26ML (MISCELLANEOUS) ×2 IMPLANT
CORDS BIPOLAR (ELECTRODE) ×2 IMPLANT
COVER MAYO STAND STRL (DRAPES) ×2 IMPLANT
COVER TABLE BACK 60X90 (DRAPES) ×2 IMPLANT
CUFF TOURNIQUET SINGLE 18IN (TOURNIQUET CUFF) ×2 IMPLANT
DRAPE EXTREMITY T 121X128X90 (DRAPE) ×2 IMPLANT
DRAPE SURG 17X23 STRL (DRAPES) ×2 IMPLANT
DRSG PAD ABDOMINAL 8X10 ST (GAUZE/BANDAGES/DRESSINGS) ×2 IMPLANT
GAUZE XEROFORM 1X8 LF (GAUZE/BANDAGES/DRESSINGS) ×2 IMPLANT
GLOVE BIO SURGEON STRL SZ7.5 (GLOVE) ×2 IMPLANT
GLOVE BIOGEL PI IND STRL 7.0 (GLOVE) ×1 IMPLANT
GLOVE BIOGEL PI IND STRL 8 (GLOVE) ×1 IMPLANT
GLOVE BIOGEL PI INDICATOR 7.0 (GLOVE) ×1
GLOVE BIOGEL PI INDICATOR 8 (GLOVE) ×1
GLOVE ECLIPSE 6.5 STRL STRAW (GLOVE) ×2 IMPLANT
GLOVE EXAM NITRILE PF MED BLUE (GLOVE) ×2 IMPLANT
GOWN BRE IMP PREV XXLGXLNG (GOWN DISPOSABLE) ×2 IMPLANT
GOWN PREVENTION PLUS XLARGE (GOWN DISPOSABLE) ×2 IMPLANT
NEEDLE HYPO 25X1 1.5 SAFETY (NEEDLE) ×2 IMPLANT
NS IRRIG 1000ML POUR BTL (IV SOLUTION) ×2 IMPLANT
PACK BASIN DAY SURGERY FS (CUSTOM PROCEDURE TRAY) ×2 IMPLANT
PADDING CAST ABS 4INX4YD NS (CAST SUPPLIES) ×1
PADDING CAST ABS COTTON 4X4 ST (CAST SUPPLIES) ×1 IMPLANT
SPONGE GAUZE 4X4 12PLY (GAUZE/BANDAGES/DRESSINGS) ×2 IMPLANT
STOCKINETTE 4X48 STRL (DRAPES) ×2 IMPLANT
SUT ETHILON 4 0 PS 2 18 (SUTURE) ×2 IMPLANT
SYR BULB 3OZ (MISCELLANEOUS) ×2 IMPLANT
SYR CONTROL 10ML LL (SYRINGE) ×2 IMPLANT
TOWEL OR 17X24 6PK STRL BLUE (TOWEL DISPOSABLE) ×2 IMPLANT
UNDERPAD 30X30 INCONTINENT (UNDERPADS AND DIAPERS) ×2 IMPLANT

## 2013-06-29 NOTE — Brief Op Note (Signed)
06/29/2013  10:32 AM  PATIENT:  Cassandra English  79 y.o. female  PRE-OPERATIVE DIAGNOSIS:  right carpal tunnel syndrome  POST-OPERATIVE DIAGNOSIS:  right carpal tunnel syndrome  PROCEDURE:  Procedure(s): RIGHT CARPAL TUNNEL RELEASE (Right)  SURGEON:  Surgeon(s) and Role:    * Tami Ribas, MD - Primary  PHYSICIAN ASSISTANT:   ASSISTANTS: none   ANESTHESIA:   Bier block  EBL:     BLOOD ADMINISTERED:none  DRAINS: none   LOCAL MEDICATIONS USED:  MARCAINE     SPECIMEN:  No Specimen  DISPOSITION OF SPECIMEN:  N/A  COUNTS:  YES  TOURNIQUET:   Total Tourniquet Time Documented: Forearm (Right) - 31 minutes Total: Forearm (Right) - 31 minutes   DICTATION: .Other Dictation: Dictation Number [id number]  PLAN OF CARE: Discharge to home after PACU  PATIENT DISPOSITION:  PACU - hemodynamically stable.

## 2013-06-29 NOTE — Transfer of Care (Signed)
Immediate Anesthesia Transfer of Care Note  Patient: Cassandra English  Procedure(s) Performed: Procedure(s): RIGHT CARPAL TUNNEL RELEASE (Right)  Patient Location: PACU  Anesthesia Type:MAC and Bier block  Level of Consciousness: awake  Airway & Oxygen Therapy: Patient Spontanous Breathing and Patient connected to face mask oxygen  Post-op Assessment: Report given to PACU RN  Post vital signs: Reviewed and stable  Complications: No apparent anesthesia complications

## 2013-06-29 NOTE — H&P (Signed)
Cassandra English is an 79 y.o. female.   Chief Complaint: carpal tunnel syndrome HPI: 79 yo rhd female with numbness in finger tips of right more than left hands.  This is bothersome to her.  She wishes to have carpal tunnel release for management of symptoms.  Positive nerve conduction studies.    Past Medical History  Diagnosis Date  . HTN (hypertension)   . Hyperlipidemia   . Leukopenia     chronic. hx of hem consult years ago  . Hypersomnia with sleep apnea     on CPAP  . Asthma   . GERD (gastroesophageal reflux disease)     EGD 10 99 and 7 00  . ALLERGIC RHINITIS   . Diverticulitis      1 perf colon 10 99  . Anxiety   . Anemia     with borderline low wbc and neg heme eval 10 years ago.   . Syncope 2010    echo ok. cards consult    . Multinodular goiter 2011    get yearly [place]  . Cataract   . Callus   . Corns and callosities   . Wears glasses     Past Surgical History  Procedure Laterality Date  . Total abdominal hysterectomy    . Cataract extraction    . Foot surgery Right 1990's     Hammer toe surgery    Family History  Problem Relation Age of Onset  . Coronary artery disease Mother   . Coronary artery disease Father   . Stroke Sister   . Colon cancer Neg Hx    Social History:  reports that she has never smoked. She has never used smokeless tobacco. She reports that she does not drink alcohol or use illicit drugs.  Allergies:  Allergies  Allergen Reactions  . Azithromycin   . Propoxyphene-Acetaminophen   . Simvastatin     REACTION: unspecified    Medications Prior to Admission  Medication Sig Dispense Refill  . amLODipine (NORVASC) 5 MG tablet Take 5 mg by mouth daily.      Marland Kitchen aspirin 81 MG tablet Take 81 mg by mouth daily.        . cholecalciferol (VITAMIN D-400) 400 UNIT TABS Take 400 Units by mouth daily.        Marland Kitchen losartan (COZAAR) 50 MG tablet TAKE ONE-HALF TABLET BY MOUTH EVERY DAY  30 tablet  5  . Multiple Vitamin (MULTIVITAMIN) capsule         . NON FORMULARY CPAP machine.  Use as directed at bedtime.      Marland Kitchen omeprazole (PRILOSEC OTC) 20 MG tablet 20 mg.         No results found for this or any previous visit (from the past 48 hour(s)).  No results found.   A comprehensive review of systems was negative except for: Eyes: positive for contacts/glasses  Height 5\' 3"  (1.6 m), weight 150 lb (68.04 kg).  General appearance: alert, cooperative and appears stated age Head: Normocephalic, without obvious abnormality, atraumatic Neck: supple, symmetrical, trachea midline Resp: clear to auscultation bilaterally Cardio: regular rate and rhythm GI: non tender Extremities: decreased sensation in thumb, index, long fingers.  intact capillary refill all digits.  +epl/fpl/io.  thenar atrophy noted. Pulses: 2+ and symmetric Skin: Skin color, texture, turgor normal. No rashes or lesions Neurologic: Grossly normal Incision/Wound: none  Assessment/Plan Bilateral carpal tunnel syndrome. Non operative and operative treatment options were discussed with the patient and patient wishes to proceed with  operative treatment. Risks, benefits, and alternatives of surgery were discussed and the patient agrees with the plan of care.  Plan right carpal tunnel release.   Ion Gonnella R 06/29/2013, 8:29 AM

## 2013-06-29 NOTE — Anesthesia Procedure Notes (Signed)
Procedure Name: MAC Date/Time: 06/29/2013 9:55 AM Performed by: Zenia Resides D Pre-anesthesia Checklist: Patient identified, Timeout performed, Emergency Drugs available, Suction available and Patient being monitored Patient Re-evaluated:Patient Re-evaluated prior to inductionOxygen Delivery Method: Simple face mask Intubation Type: IV induction Placement Confirmation: positive ETCO2 and CO2 detector

## 2013-06-29 NOTE — Anesthesia Preprocedure Evaluation (Signed)
Anesthesia Evaluation  Patient identified by MRN, date of birth, ID band Patient awake    Airway Mallampati: I TM Distance: >3 FB Neck ROM: Full    Dental  (+) Dental Advisory Given and Partial Upper   Pulmonary asthma ,  breath sounds clear to auscultation        Cardiovascular hypertension, Pt. on medications Rhythm:Regular Rate:Normal     Neuro/Psych    GI/Hepatic GERD-  Medicated and Controlled,  Endo/Other    Renal/GU      Musculoskeletal   Abdominal   Peds  Hematology   Anesthesia Other Findings   Reproductive/Obstetrics                           Anesthesia Physical Anesthesia Plan  ASA: III  Anesthesia Plan: MAC and Bier Block   Post-op Pain Management:    Induction: Intravenous  Airway Management Planned: Simple Face Mask  Additional Equipment:   Intra-op Plan:   Post-operative Plan:   Informed Consent: I have reviewed the patients History and Physical, chart, labs and discussed the procedure including the risks, benefits and alternatives for the proposed anesthesia with the patient or authorized representative who has indicated his/her understanding and acceptance.   Dental advisory given  Plan Discussed with: CRNA, Anesthesiologist and Surgeon  Anesthesia Plan Comments:         Anesthesia Quick Evaluation

## 2013-06-29 NOTE — Op Note (Signed)
[id number] 

## 2013-06-29 NOTE — Anesthesia Postprocedure Evaluation (Signed)
  Anesthesia Post-op Note  Patient: Cassandra English  Procedure(s) Performed: Procedure(s): RIGHT CARPAL TUNNEL RELEASE (Right)  Patient Location: PACU  Anesthesia Type:Bier block and MAC combined with regional for post-op pain  Level of Consciousness: awake, alert  and oriented  Airway and Oxygen Therapy: Patient Spontanous Breathing and Patient connected to face mask oxygen  Post-op Pain: none  Post-op Assessment: Post-op Vital signs reviewed  Post-op Vital Signs: Reviewed  Complications: No apparent anesthesia complications

## 2013-06-30 ENCOUNTER — Encounter (HOSPITAL_BASED_OUTPATIENT_CLINIC_OR_DEPARTMENT_OTHER): Payer: Self-pay | Admitting: Orthopedic Surgery

## 2013-06-30 NOTE — Op Note (Signed)
Cassandra English, Cassandra English             ACCOUNT NO.:  [id number]  MEDICAL RECORD NO.:  [id number]  LOCATION:                                 FACILITY:  PHYSICIAN:  Betha Loa, MD             DATE OF BIRTH:  DATE OF PROCEDURE:  06/29/2013 DATE OF DISCHARGE:                              OPERATIVE REPORT   PREOPERATIVE DIAGNOSIS:  Right carpal tunnel syndrome.  POSTOPERATIVE DIAGNOSIS:  Right carpal tunnel syndrome.  PROCEDURE:  Right carpal tunnel release.  SURGEON:  Betha Loa, MD  ASSISTANT:  None.  ANESTHESIA:  Bier block with sedation.  IV FLUIDS:  Per anesthesia flow sheet.  ESTIMATED BLOOD LOSS:  Minimal.  COMPLICATIONS:  None.  SPECIMENS:  None.  TOURNIQUET TIME:  31 minutes.  DISPOSITION:  Stable to PACU.  INDICATIONS:  Cassandra English is an 79 year old female who has had pins and needle sensation in the thumb, index, and long fingers.  This was bothersome to her.  She has thenar atrophy.  She wishes to have a carpal tunnel release for management of the symptoms.  She has positive nerve conduction studies.  Risks, benefits and alternatives of the surgery were discussed including the risk of blood loss; infection; damage to nerves, vessels, tendons, ligaments, bone; failure of surgery; need for additional surgery; complications with wound healing; continued pain; and recurrent carpal tunnel syndrome.  She voiced understanding of these risks and elected to proceed.  OPERATIVE COURSE:  After being identified preoperatively by myself, the patient and I agreed upon the procedure and site of procedure.  Surgical site was marked.  The risks, benefits, and alternatives of the surgery were reviewed and she wished to proceed.  Surgical consent had been signed.  She was given IV antibiotics as preoperative antibiotic prophylaxis.  She was transferred to the operating room and placed on the operating room table in supine position with the right upper extremity on an armboard.   Bier block anesthesia was induced by anesthesiologist.  The right upper extremity was prepped and draped in normal sterile orthopedic fashion.  Surgical pause was performed between the surgeons, anesthesia, and operating room staff, and all were in agreement as to the patient, procedure, and site of procedure.  The tourniquet at the proximal aspect of the forearm had been inflated for the Bier block.  Incision was made over the transverse carpal ligament. It was carried into subcutaneous tissues by spreading technique. Bipolar electrocautery was used to obtain hemostasis.  The palmar fascia was incised.  The thenar and hypothenar musculature almost met at the transverse carpal ligament, and actually crossed in some places.  This was freed up by scraping.  The transverse carpal ligament was identified and incised sharply.  Care was taken to ensure complete decompression distally, which was the case.  The ulnar neurovascular bundle was protected.  The ligament was then incised proximally.  The scissors were used to split the distal aspect of the volar antebrachial fascia.  A finger was placed into the wound to ensure complete decompression, which was the case.  The nerve was inspected.  It was flattened and adherent to the radial leaflet.  The motor  branch was identified and it was intact.  The wound was copiously irrigated with sterile saline.  It was closed with 4-0 nylon in a horizontal mattress fashion.  It was injected with 7 mL of 0.25% plain Marcaine to aid in postoperative analgesia.  It was then dressed with sterile Xeroform, 4x4s, and ABD, and wrapped with Kerlix and Ace bandage.  Tourniquet was deflated at 31 minutes. Fingertips were pink with brisk capillary refill after deflation of the tourniquet.  Operative drapes were broken down, and the patient was awoken from anesthesia safely.  She was transferred back to stretcher and taken to PACU in stable condition.  I will see her  back in the office in 1 week for postoperative followup.  I will give her Norco 5/325, 1-2 p.o. q.6 hours p.r.n. pain, dispensed #30.     Betha Loa, MD     KK/MEDQ  D:  06/29/2013  T:  06/30/2013  Job:  [id number]

## 2013-07-16 DIAGNOSIS — G5600 Carpal tunnel syndrome, unspecified upper limb: Secondary | ICD-10-CM | POA: Diagnosis not present

## 2013-07-29 ENCOUNTER — Other Ambulatory Visit: Payer: Self-pay | Admitting: Internal Medicine

## 2013-08-10 DIAGNOSIS — M19019 Primary osteoarthritis, unspecified shoulder: Secondary | ICD-10-CM | POA: Diagnosis not present

## 2013-08-10 DIAGNOSIS — IMO0002 Reserved for concepts with insufficient information to code with codable children: Secondary | ICD-10-CM | POA: Diagnosis not present

## 2013-08-18 DIAGNOSIS — M19019 Primary osteoarthritis, unspecified shoulder: Secondary | ICD-10-CM | POA: Diagnosis not present

## 2013-08-18 DIAGNOSIS — IMO0002 Reserved for concepts with insufficient information to code with codable children: Secondary | ICD-10-CM | POA: Diagnosis not present

## 2013-08-25 DIAGNOSIS — M19019 Primary osteoarthritis, unspecified shoulder: Secondary | ICD-10-CM | POA: Diagnosis not present

## 2013-08-25 DIAGNOSIS — IMO0002 Reserved for concepts with insufficient information to code with codable children: Secondary | ICD-10-CM | POA: Diagnosis not present

## 2013-09-01 DIAGNOSIS — IMO0002 Reserved for concepts with insufficient information to code with codable children: Secondary | ICD-10-CM | POA: Diagnosis not present

## 2013-09-01 DIAGNOSIS — M19019 Primary osteoarthritis, unspecified shoulder: Secondary | ICD-10-CM | POA: Diagnosis not present

## 2013-09-08 DIAGNOSIS — IMO0002 Reserved for concepts with insufficient information to code with codable children: Secondary | ICD-10-CM | POA: Diagnosis not present

## 2013-09-08 DIAGNOSIS — M19019 Primary osteoarthritis, unspecified shoulder: Secondary | ICD-10-CM | POA: Diagnosis not present

## 2013-09-23 DIAGNOSIS — H35369 Drusen (degenerative) of macula, unspecified eye: Secondary | ICD-10-CM | POA: Diagnosis not present

## 2013-09-23 DIAGNOSIS — I10 Essential (primary) hypertension: Secondary | ICD-10-CM | POA: Diagnosis not present

## 2013-09-23 DIAGNOSIS — Z961 Presence of intraocular lens: Secondary | ICD-10-CM | POA: Diagnosis not present

## 2013-09-23 DIAGNOSIS — IMO0002 Reserved for concepts with insufficient information to code with codable children: Secondary | ICD-10-CM | POA: Diagnosis not present

## 2013-09-23 DIAGNOSIS — M19019 Primary osteoarthritis, unspecified shoulder: Secondary | ICD-10-CM | POA: Diagnosis not present

## 2013-09-29 ENCOUNTER — Encounter (INDEPENDENT_AMBULATORY_CARE_PROVIDER_SITE_OTHER): Payer: Medicare Other | Admitting: Ophthalmology

## 2013-09-29 DIAGNOSIS — I10 Essential (primary) hypertension: Secondary | ICD-10-CM

## 2013-09-29 DIAGNOSIS — H35039 Hypertensive retinopathy, unspecified eye: Secondary | ICD-10-CM

## 2013-09-29 DIAGNOSIS — H353290 Exudative age-related macular degeneration, unspecified eye, stage unspecified: Secondary | ICD-10-CM | POA: Diagnosis not present

## 2013-09-29 DIAGNOSIS — H43819 Vitreous degeneration, unspecified eye: Secondary | ICD-10-CM

## 2013-09-29 DIAGNOSIS — H3530 Unspecified macular degeneration: Secondary | ICD-10-CM

## 2013-10-03 DIAGNOSIS — M159 Polyosteoarthritis, unspecified: Secondary | ICD-10-CM | POA: Diagnosis not present

## 2013-10-03 DIAGNOSIS — I10 Essential (primary) hypertension: Secondary | ICD-10-CM | POA: Diagnosis not present

## 2013-10-03 DIAGNOSIS — E7800 Pure hypercholesterolemia, unspecified: Secondary | ICD-10-CM | POA: Diagnosis not present

## 2013-10-07 DIAGNOSIS — IMO0002 Reserved for concepts with insufficient information to code with codable children: Secondary | ICD-10-CM | POA: Diagnosis not present

## 2013-10-07 DIAGNOSIS — M6530 Trigger finger, unspecified finger: Secondary | ICD-10-CM | POA: Diagnosis not present

## 2013-10-07 DIAGNOSIS — M19019 Primary osteoarthritis, unspecified shoulder: Secondary | ICD-10-CM | POA: Diagnosis not present

## 2013-10-12 ENCOUNTER — Other Ambulatory Visit (INDEPENDENT_AMBULATORY_CARE_PROVIDER_SITE_OTHER): Payer: Medicare Other | Admitting: Ophthalmology

## 2013-10-12 DIAGNOSIS — H35059 Retinal neovascularization, unspecified, unspecified eye: Secondary | ICD-10-CM | POA: Diagnosis not present

## 2013-10-27 ENCOUNTER — Encounter (INDEPENDENT_AMBULATORY_CARE_PROVIDER_SITE_OTHER): Payer: Medicare Other | Admitting: Ophthalmology

## 2013-10-27 DIAGNOSIS — H353290 Exudative age-related macular degeneration, unspecified eye, stage unspecified: Secondary | ICD-10-CM | POA: Diagnosis not present

## 2013-10-27 DIAGNOSIS — H3530 Unspecified macular degeneration: Secondary | ICD-10-CM | POA: Diagnosis not present

## 2013-10-27 DIAGNOSIS — H35039 Hypertensive retinopathy, unspecified eye: Secondary | ICD-10-CM

## 2013-10-27 DIAGNOSIS — I10 Essential (primary) hypertension: Secondary | ICD-10-CM | POA: Diagnosis not present

## 2013-10-27 DIAGNOSIS — H43819 Vitreous degeneration, unspecified eye: Secondary | ICD-10-CM

## 2013-11-24 ENCOUNTER — Encounter (INDEPENDENT_AMBULATORY_CARE_PROVIDER_SITE_OTHER): Payer: Medicare Other | Admitting: Ophthalmology

## 2013-11-24 DIAGNOSIS — I10 Essential (primary) hypertension: Secondary | ICD-10-CM

## 2013-11-24 DIAGNOSIS — H353290 Exudative age-related macular degeneration, unspecified eye, stage unspecified: Secondary | ICD-10-CM | POA: Diagnosis not present

## 2013-11-24 DIAGNOSIS — H35039 Hypertensive retinopathy, unspecified eye: Secondary | ICD-10-CM

## 2013-11-24 DIAGNOSIS — H3530 Unspecified macular degeneration: Secondary | ICD-10-CM | POA: Diagnosis not present

## 2013-11-24 DIAGNOSIS — H43819 Vitreous degeneration, unspecified eye: Secondary | ICD-10-CM

## 2013-11-27 DIAGNOSIS — M19019 Primary osteoarthritis, unspecified shoulder: Secondary | ICD-10-CM | POA: Diagnosis not present

## 2013-11-27 DIAGNOSIS — IMO0002 Reserved for concepts with insufficient information to code with codable children: Secondary | ICD-10-CM | POA: Diagnosis not present

## 2013-11-27 DIAGNOSIS — M6530 Trigger finger, unspecified finger: Secondary | ICD-10-CM | POA: Diagnosis not present

## 2013-12-01 ENCOUNTER — Other Ambulatory Visit (INDEPENDENT_AMBULATORY_CARE_PROVIDER_SITE_OTHER): Payer: Medicare Other | Admitting: Ophthalmology

## 2013-12-01 DIAGNOSIS — H35059 Retinal neovascularization, unspecified, unspecified eye: Secondary | ICD-10-CM

## 2013-12-15 DIAGNOSIS — D649 Anemia, unspecified: Secondary | ICD-10-CM | POA: Diagnosis not present

## 2013-12-15 DIAGNOSIS — E785 Hyperlipidemia, unspecified: Secondary | ICD-10-CM | POA: Diagnosis not present

## 2013-12-15 DIAGNOSIS — I10 Essential (primary) hypertension: Secondary | ICD-10-CM | POA: Diagnosis not present

## 2013-12-22 ENCOUNTER — Encounter (INDEPENDENT_AMBULATORY_CARE_PROVIDER_SITE_OTHER): Payer: Medicare Other | Admitting: Ophthalmology

## 2013-12-22 DIAGNOSIS — H35039 Hypertensive retinopathy, unspecified eye: Secondary | ICD-10-CM | POA: Diagnosis not present

## 2013-12-22 DIAGNOSIS — I10 Essential (primary) hypertension: Secondary | ICD-10-CM | POA: Diagnosis not present

## 2013-12-22 DIAGNOSIS — H353290 Exudative age-related macular degeneration, unspecified eye, stage unspecified: Secondary | ICD-10-CM | POA: Diagnosis not present

## 2013-12-22 DIAGNOSIS — H3530 Unspecified macular degeneration: Secondary | ICD-10-CM

## 2013-12-22 DIAGNOSIS — H43819 Vitreous degeneration, unspecified eye: Secondary | ICD-10-CM

## 2014-01-08 DIAGNOSIS — M19049 Primary osteoarthritis, unspecified hand: Secondary | ICD-10-CM | POA: Diagnosis not present

## 2014-01-08 DIAGNOSIS — G5600 Carpal tunnel syndrome, unspecified upper limb: Secondary | ICD-10-CM | POA: Diagnosis not present

## 2014-01-13 DIAGNOSIS — M19049 Primary osteoarthritis, unspecified hand: Secondary | ICD-10-CM | POA: Diagnosis not present

## 2014-01-13 DIAGNOSIS — G5600 Carpal tunnel syndrome, unspecified upper limb: Secondary | ICD-10-CM | POA: Diagnosis not present

## 2014-01-14 DIAGNOSIS — G9332 Myalgic encephalomyelitis/chronic fatigue syndrome: Secondary | ICD-10-CM | POA: Diagnosis not present

## 2014-01-14 DIAGNOSIS — E7800 Pure hypercholesterolemia, unspecified: Secondary | ICD-10-CM | POA: Diagnosis not present

## 2014-01-14 DIAGNOSIS — R5382 Chronic fatigue, unspecified: Secondary | ICD-10-CM | POA: Diagnosis not present

## 2014-01-14 DIAGNOSIS — IMO0001 Reserved for inherently not codable concepts without codable children: Secondary | ICD-10-CM | POA: Diagnosis not present

## 2014-01-14 DIAGNOSIS — I10 Essential (primary) hypertension: Secondary | ICD-10-CM | POA: Diagnosis not present

## 2014-01-14 DIAGNOSIS — E538 Deficiency of other specified B group vitamins: Secondary | ICD-10-CM | POA: Diagnosis not present

## 2014-01-14 DIAGNOSIS — D649 Anemia, unspecified: Secondary | ICD-10-CM | POA: Diagnosis not present

## 2014-01-19 ENCOUNTER — Encounter (INDEPENDENT_AMBULATORY_CARE_PROVIDER_SITE_OTHER): Payer: Medicare Other | Admitting: Ophthalmology

## 2014-01-19 DIAGNOSIS — H353290 Exudative age-related macular degeneration, unspecified eye, stage unspecified: Secondary | ICD-10-CM | POA: Diagnosis not present

## 2014-01-19 DIAGNOSIS — H35039 Hypertensive retinopathy, unspecified eye: Secondary | ICD-10-CM

## 2014-01-19 DIAGNOSIS — I10 Essential (primary) hypertension: Secondary | ICD-10-CM | POA: Diagnosis not present

## 2014-01-19 DIAGNOSIS — H3530 Unspecified macular degeneration: Secondary | ICD-10-CM

## 2014-01-19 DIAGNOSIS — H43819 Vitreous degeneration, unspecified eye: Secondary | ICD-10-CM

## 2014-01-22 DIAGNOSIS — G5600 Carpal tunnel syndrome, unspecified upper limb: Secondary | ICD-10-CM | POA: Diagnosis not present

## 2014-01-22 DIAGNOSIS — M19049 Primary osteoarthritis, unspecified hand: Secondary | ICD-10-CM | POA: Diagnosis not present

## 2014-02-02 DIAGNOSIS — G5600 Carpal tunnel syndrome, unspecified upper limb: Secondary | ICD-10-CM | POA: Diagnosis not present

## 2014-02-02 DIAGNOSIS — M19049 Primary osteoarthritis, unspecified hand: Secondary | ICD-10-CM | POA: Diagnosis not present

## 2014-02-04 DIAGNOSIS — I10 Essential (primary) hypertension: Secondary | ICD-10-CM | POA: Diagnosis not present

## 2014-02-04 DIAGNOSIS — R5381 Other malaise: Secondary | ICD-10-CM | POA: Diagnosis not present

## 2014-02-04 DIAGNOSIS — R5383 Other fatigue: Secondary | ICD-10-CM | POA: Diagnosis not present

## 2014-02-04 DIAGNOSIS — E7800 Pure hypercholesterolemia, unspecified: Secondary | ICD-10-CM | POA: Diagnosis not present

## 2014-02-09 DIAGNOSIS — G5600 Carpal tunnel syndrome, unspecified upper limb: Secondary | ICD-10-CM | POA: Diagnosis not present

## 2014-02-09 DIAGNOSIS — M19049 Primary osteoarthritis, unspecified hand: Secondary | ICD-10-CM | POA: Diagnosis not present

## 2014-02-15 DIAGNOSIS — M19049 Primary osteoarthritis, unspecified hand: Secondary | ICD-10-CM | POA: Diagnosis not present

## 2014-02-15 DIAGNOSIS — G5600 Carpal tunnel syndrome, unspecified upper limb: Secondary | ICD-10-CM | POA: Diagnosis not present

## 2014-02-16 ENCOUNTER — Encounter (INDEPENDENT_AMBULATORY_CARE_PROVIDER_SITE_OTHER): Payer: Medicare Other | Admitting: Ophthalmology

## 2014-02-16 DIAGNOSIS — H353290 Exudative age-related macular degeneration, unspecified eye, stage unspecified: Secondary | ICD-10-CM

## 2014-02-16 DIAGNOSIS — I10 Essential (primary) hypertension: Secondary | ICD-10-CM

## 2014-02-16 DIAGNOSIS — H3530 Unspecified macular degeneration: Secondary | ICD-10-CM

## 2014-02-16 DIAGNOSIS — H35039 Hypertensive retinopathy, unspecified eye: Secondary | ICD-10-CM

## 2014-02-16 DIAGNOSIS — H43819 Vitreous degeneration, unspecified eye: Secondary | ICD-10-CM

## 2014-02-22 DIAGNOSIS — G5600 Carpal tunnel syndrome, unspecified upper limb: Secondary | ICD-10-CM | POA: Diagnosis not present

## 2014-02-22 DIAGNOSIS — M19049 Primary osteoarthritis, unspecified hand: Secondary | ICD-10-CM | POA: Diagnosis not present

## 2014-03-01 DIAGNOSIS — M19049 Primary osteoarthritis, unspecified hand: Secondary | ICD-10-CM | POA: Diagnosis not present

## 2014-03-01 DIAGNOSIS — G5600 Carpal tunnel syndrome, unspecified upper limb: Secondary | ICD-10-CM | POA: Diagnosis not present

## 2014-03-08 DIAGNOSIS — G5600 Carpal tunnel syndrome, unspecified upper limb: Secondary | ICD-10-CM | POA: Diagnosis not present

## 2014-03-08 DIAGNOSIS — M19049 Primary osteoarthritis, unspecified hand: Secondary | ICD-10-CM | POA: Diagnosis not present

## 2014-03-12 DIAGNOSIS — G5600 Carpal tunnel syndrome, unspecified upper limb: Secondary | ICD-10-CM | POA: Diagnosis not present

## 2014-03-12 DIAGNOSIS — M19049 Primary osteoarthritis, unspecified hand: Secondary | ICD-10-CM | POA: Diagnosis not present

## 2014-03-16 ENCOUNTER — Encounter (INDEPENDENT_AMBULATORY_CARE_PROVIDER_SITE_OTHER): Payer: Medicare Other | Admitting: Ophthalmology

## 2014-03-16 DIAGNOSIS — H353290 Exudative age-related macular degeneration, unspecified eye, stage unspecified: Secondary | ICD-10-CM

## 2014-03-16 DIAGNOSIS — H43819 Vitreous degeneration, unspecified eye: Secondary | ICD-10-CM

## 2014-03-16 DIAGNOSIS — H3530 Unspecified macular degeneration: Secondary | ICD-10-CM | POA: Diagnosis not present

## 2014-03-16 DIAGNOSIS — H35039 Hypertensive retinopathy, unspecified eye: Secondary | ICD-10-CM

## 2014-03-16 DIAGNOSIS — I10 Essential (primary) hypertension: Secondary | ICD-10-CM | POA: Diagnosis not present

## 2014-03-16 DIAGNOSIS — H348392 Tributary (branch) retinal vein occlusion, unspecified eye, stable: Secondary | ICD-10-CM

## 2014-04-27 ENCOUNTER — Encounter (INDEPENDENT_AMBULATORY_CARE_PROVIDER_SITE_OTHER): Payer: Medicare Other | Admitting: Ophthalmology

## 2014-04-27 DIAGNOSIS — I10 Essential (primary) hypertension: Secondary | ICD-10-CM | POA: Diagnosis not present

## 2014-04-27 DIAGNOSIS — H348392 Tributary (branch) retinal vein occlusion, unspecified eye, stable: Secondary | ICD-10-CM

## 2014-04-27 DIAGNOSIS — H43819 Vitreous degeneration, unspecified eye: Secondary | ICD-10-CM

## 2014-04-27 DIAGNOSIS — H35039 Hypertensive retinopathy, unspecified eye: Secondary | ICD-10-CM

## 2014-04-27 DIAGNOSIS — H353290 Exudative age-related macular degeneration, unspecified eye, stage unspecified: Secondary | ICD-10-CM | POA: Diagnosis not present

## 2014-04-27 DIAGNOSIS — H3530 Unspecified macular degeneration: Secondary | ICD-10-CM | POA: Diagnosis not present

## 2014-05-06 DIAGNOSIS — Z23 Encounter for immunization: Secondary | ICD-10-CM | POA: Diagnosis not present

## 2014-05-06 DIAGNOSIS — I10 Essential (primary) hypertension: Secondary | ICD-10-CM | POA: Diagnosis not present

## 2014-05-06 DIAGNOSIS — E780 Pure hypercholesterolemia: Secondary | ICD-10-CM | POA: Diagnosis not present

## 2014-05-06 DIAGNOSIS — G4730 Sleep apnea, unspecified: Secondary | ICD-10-CM | POA: Diagnosis not present

## 2014-06-03 DIAGNOSIS — Z1231 Encounter for screening mammogram for malignant neoplasm of breast: Secondary | ICD-10-CM | POA: Diagnosis not present

## 2014-06-08 ENCOUNTER — Encounter (INDEPENDENT_AMBULATORY_CARE_PROVIDER_SITE_OTHER): Payer: Medicare Other | Admitting: Ophthalmology

## 2014-06-08 DIAGNOSIS — H43813 Vitreous degeneration, bilateral: Secondary | ICD-10-CM

## 2014-06-08 DIAGNOSIS — H3532 Exudative age-related macular degeneration: Secondary | ICD-10-CM | POA: Diagnosis not present

## 2014-06-08 DIAGNOSIS — I10 Essential (primary) hypertension: Secondary | ICD-10-CM

## 2014-06-08 DIAGNOSIS — H34831 Tributary (branch) retinal vein occlusion, right eye: Secondary | ICD-10-CM

## 2014-06-08 DIAGNOSIS — H35033 Hypertensive retinopathy, bilateral: Secondary | ICD-10-CM

## 2014-07-05 DIAGNOSIS — I10 Essential (primary) hypertension: Secondary | ICD-10-CM | POA: Diagnosis not present

## 2014-07-05 DIAGNOSIS — E780 Pure hypercholesterolemia: Secondary | ICD-10-CM | POA: Diagnosis not present

## 2014-07-05 DIAGNOSIS — M150 Primary generalized (osteo)arthritis: Secondary | ICD-10-CM | POA: Diagnosis not present

## 2014-07-21 ENCOUNTER — Encounter (INDEPENDENT_AMBULATORY_CARE_PROVIDER_SITE_OTHER): Payer: Medicare Other | Admitting: Ophthalmology

## 2014-07-21 DIAGNOSIS — H43813 Vitreous degeneration, bilateral: Secondary | ICD-10-CM

## 2014-07-21 DIAGNOSIS — I10 Essential (primary) hypertension: Secondary | ICD-10-CM

## 2014-07-21 DIAGNOSIS — H3531 Nonexudative age-related macular degeneration: Secondary | ICD-10-CM | POA: Diagnosis not present

## 2014-07-21 DIAGNOSIS — H34831 Tributary (branch) retinal vein occlusion, right eye: Secondary | ICD-10-CM | POA: Diagnosis not present

## 2014-07-21 DIAGNOSIS — H35033 Hypertensive retinopathy, bilateral: Secondary | ICD-10-CM | POA: Diagnosis not present

## 2014-09-01 ENCOUNTER — Encounter (INDEPENDENT_AMBULATORY_CARE_PROVIDER_SITE_OTHER): Payer: Medicare Other | Admitting: Ophthalmology

## 2014-09-01 DIAGNOSIS — H3532 Exudative age-related macular degeneration: Secondary | ICD-10-CM | POA: Diagnosis not present

## 2014-09-01 DIAGNOSIS — H35033 Hypertensive retinopathy, bilateral: Secondary | ICD-10-CM

## 2014-09-01 DIAGNOSIS — H34831 Tributary (branch) retinal vein occlusion, right eye: Secondary | ICD-10-CM

## 2014-09-01 DIAGNOSIS — I10 Essential (primary) hypertension: Secondary | ICD-10-CM | POA: Diagnosis not present

## 2014-09-01 DIAGNOSIS — H43813 Vitreous degeneration, bilateral: Secondary | ICD-10-CM | POA: Diagnosis not present

## 2014-10-04 DIAGNOSIS — E780 Pure hypercholesterolemia: Secondary | ICD-10-CM | POA: Diagnosis not present

## 2014-10-04 DIAGNOSIS — M150 Primary generalized (osteo)arthritis: Secondary | ICD-10-CM | POA: Diagnosis not present

## 2014-10-04 DIAGNOSIS — I10 Essential (primary) hypertension: Secondary | ICD-10-CM | POA: Diagnosis not present

## 2014-10-04 DIAGNOSIS — E559 Vitamin D deficiency, unspecified: Secondary | ICD-10-CM | POA: Diagnosis not present

## 2014-10-13 ENCOUNTER — Encounter (INDEPENDENT_AMBULATORY_CARE_PROVIDER_SITE_OTHER): Payer: Medicare Other | Admitting: Ophthalmology

## 2014-10-13 DIAGNOSIS — H35033 Hypertensive retinopathy, bilateral: Secondary | ICD-10-CM

## 2014-10-13 DIAGNOSIS — H3531 Nonexudative age-related macular degeneration: Secondary | ICD-10-CM

## 2014-10-13 DIAGNOSIS — H43813 Vitreous degeneration, bilateral: Secondary | ICD-10-CM | POA: Diagnosis not present

## 2014-10-13 DIAGNOSIS — I10 Essential (primary) hypertension: Secondary | ICD-10-CM

## 2014-10-13 DIAGNOSIS — H34831 Tributary (branch) retinal vein occlusion, right eye: Secondary | ICD-10-CM

## 2014-11-24 ENCOUNTER — Encounter (INDEPENDENT_AMBULATORY_CARE_PROVIDER_SITE_OTHER): Payer: Medicare Other | Admitting: Ophthalmology

## 2014-11-24 DIAGNOSIS — H3532 Exudative age-related macular degeneration: Secondary | ICD-10-CM | POA: Diagnosis not present

## 2014-11-24 DIAGNOSIS — H3531 Nonexudative age-related macular degeneration: Secondary | ICD-10-CM | POA: Diagnosis not present

## 2014-11-24 DIAGNOSIS — H34831 Tributary (branch) retinal vein occlusion, right eye: Secondary | ICD-10-CM | POA: Diagnosis not present

## 2014-12-06 DIAGNOSIS — M150 Primary generalized (osteo)arthritis: Secondary | ICD-10-CM | POA: Diagnosis not present

## 2014-12-06 DIAGNOSIS — E780 Pure hypercholesterolemia: Secondary | ICD-10-CM | POA: Diagnosis not present

## 2014-12-06 DIAGNOSIS — I10 Essential (primary) hypertension: Secondary | ICD-10-CM | POA: Diagnosis not present

## 2014-12-22 ENCOUNTER — Encounter (INDEPENDENT_AMBULATORY_CARE_PROVIDER_SITE_OTHER): Payer: Medicare Other | Admitting: Ophthalmology

## 2014-12-22 DIAGNOSIS — H3532 Exudative age-related macular degeneration: Secondary | ICD-10-CM | POA: Diagnosis not present

## 2014-12-22 DIAGNOSIS — H35033 Hypertensive retinopathy, bilateral: Secondary | ICD-10-CM | POA: Diagnosis not present

## 2014-12-22 DIAGNOSIS — H43813 Vitreous degeneration, bilateral: Secondary | ICD-10-CM

## 2014-12-22 DIAGNOSIS — I10 Essential (primary) hypertension: Secondary | ICD-10-CM

## 2014-12-22 DIAGNOSIS — H34831 Tributary (branch) retinal vein occlusion, right eye: Secondary | ICD-10-CM | POA: Diagnosis not present

## 2014-12-22 DIAGNOSIS — H3531 Nonexudative age-related macular degeneration: Secondary | ICD-10-CM | POA: Diagnosis not present

## 2015-02-02 ENCOUNTER — Encounter (INDEPENDENT_AMBULATORY_CARE_PROVIDER_SITE_OTHER): Payer: Medicare Other | Admitting: Ophthalmology

## 2015-02-02 DIAGNOSIS — I10 Essential (primary) hypertension: Secondary | ICD-10-CM | POA: Diagnosis not present

## 2015-02-02 DIAGNOSIS — H34831 Tributary (branch) retinal vein occlusion, right eye: Secondary | ICD-10-CM

## 2015-02-02 DIAGNOSIS — H35033 Hypertensive retinopathy, bilateral: Secondary | ICD-10-CM | POA: Diagnosis not present

## 2015-02-02 DIAGNOSIS — H3531 Nonexudative age-related macular degeneration: Secondary | ICD-10-CM | POA: Diagnosis not present

## 2015-02-02 DIAGNOSIS — H43813 Vitreous degeneration, bilateral: Secondary | ICD-10-CM | POA: Diagnosis not present

## 2015-02-02 DIAGNOSIS — H3532 Exudative age-related macular degeneration: Secondary | ICD-10-CM

## 2015-02-24 DIAGNOSIS — Z961 Presence of intraocular lens: Secondary | ICD-10-CM | POA: Diagnosis not present

## 2015-03-07 DIAGNOSIS — R609 Edema, unspecified: Secondary | ICD-10-CM | POA: Diagnosis not present

## 2015-03-07 DIAGNOSIS — M150 Primary generalized (osteo)arthritis: Secondary | ICD-10-CM | POA: Diagnosis not present

## 2015-03-07 DIAGNOSIS — E780 Pure hypercholesterolemia: Secondary | ICD-10-CM | POA: Diagnosis not present

## 2015-03-07 DIAGNOSIS — I10 Essential (primary) hypertension: Secondary | ICD-10-CM | POA: Diagnosis not present

## 2015-03-16 ENCOUNTER — Encounter (INDEPENDENT_AMBULATORY_CARE_PROVIDER_SITE_OTHER): Payer: Medicare Other | Admitting: Ophthalmology

## 2015-03-16 DIAGNOSIS — H34831 Tributary (branch) retinal vein occlusion, right eye: Secondary | ICD-10-CM

## 2015-03-16 DIAGNOSIS — I10 Essential (primary) hypertension: Secondary | ICD-10-CM | POA: Diagnosis not present

## 2015-03-16 DIAGNOSIS — H3531 Nonexudative age-related macular degeneration: Secondary | ICD-10-CM | POA: Diagnosis not present

## 2015-03-16 DIAGNOSIS — H35033 Hypertensive retinopathy, bilateral: Secondary | ICD-10-CM | POA: Diagnosis not present

## 2015-03-16 DIAGNOSIS — H3532 Exudative age-related macular degeneration: Secondary | ICD-10-CM | POA: Diagnosis not present

## 2015-03-16 DIAGNOSIS — H43813 Vitreous degeneration, bilateral: Secondary | ICD-10-CM

## 2015-04-08 DIAGNOSIS — Z6824 Body mass index (BMI) 24.0-24.9, adult: Secondary | ICD-10-CM | POA: Diagnosis not present

## 2015-04-08 DIAGNOSIS — J309 Allergic rhinitis, unspecified: Secondary | ICD-10-CM | POA: Diagnosis not present

## 2015-04-08 DIAGNOSIS — R05 Cough: Secondary | ICD-10-CM | POA: Diagnosis not present

## 2015-04-27 ENCOUNTER — Encounter (INDEPENDENT_AMBULATORY_CARE_PROVIDER_SITE_OTHER): Payer: Medicare Other | Admitting: Ophthalmology

## 2015-04-27 DIAGNOSIS — H43813 Vitreous degeneration, bilateral: Secondary | ICD-10-CM | POA: Diagnosis not present

## 2015-04-27 DIAGNOSIS — H3531 Nonexudative age-related macular degeneration: Secondary | ICD-10-CM | POA: Diagnosis not present

## 2015-04-27 DIAGNOSIS — H3532 Exudative age-related macular degeneration: Secondary | ICD-10-CM

## 2015-04-27 DIAGNOSIS — I10 Essential (primary) hypertension: Secondary | ICD-10-CM

## 2015-04-27 DIAGNOSIS — H35033 Hypertensive retinopathy, bilateral: Secondary | ICD-10-CM

## 2015-04-27 DIAGNOSIS — H34831 Tributary (branch) retinal vein occlusion, right eye: Secondary | ICD-10-CM | POA: Diagnosis not present

## 2015-05-02 DIAGNOSIS — Z23 Encounter for immunization: Secondary | ICD-10-CM | POA: Diagnosis not present

## 2015-06-08 ENCOUNTER — Encounter (INDEPENDENT_AMBULATORY_CARE_PROVIDER_SITE_OTHER): Payer: Medicare Other | Admitting: Ophthalmology

## 2015-06-08 DIAGNOSIS — H34831 Tributary (branch) retinal vein occlusion, right eye, with macular edema: Secondary | ICD-10-CM

## 2015-06-08 DIAGNOSIS — I10 Essential (primary) hypertension: Secondary | ICD-10-CM

## 2015-06-08 DIAGNOSIS — H353231 Exudative age-related macular degeneration, bilateral, with active choroidal neovascularization: Secondary | ICD-10-CM | POA: Diagnosis not present

## 2015-06-08 DIAGNOSIS — H43813 Vitreous degeneration, bilateral: Secondary | ICD-10-CM

## 2015-06-08 DIAGNOSIS — H35033 Hypertensive retinopathy, bilateral: Secondary | ICD-10-CM

## 2015-06-27 DIAGNOSIS — R51 Headache: Secondary | ICD-10-CM | POA: Diagnosis not present

## 2015-06-27 DIAGNOSIS — I10 Essential (primary) hypertension: Secondary | ICD-10-CM | POA: Diagnosis not present

## 2015-06-27 DIAGNOSIS — D649 Anemia, unspecified: Secondary | ICD-10-CM | POA: Diagnosis not present

## 2015-06-27 DIAGNOSIS — M150 Primary generalized (osteo)arthritis: Secondary | ICD-10-CM | POA: Diagnosis not present

## 2015-06-27 DIAGNOSIS — E781 Pure hyperglyceridemia: Secondary | ICD-10-CM | POA: Diagnosis not present

## 2015-07-20 ENCOUNTER — Encounter (INDEPENDENT_AMBULATORY_CARE_PROVIDER_SITE_OTHER): Payer: Medicare Other | Admitting: Ophthalmology

## 2015-07-20 DIAGNOSIS — H34831 Tributary (branch) retinal vein occlusion, right eye, with macular edema: Secondary | ICD-10-CM

## 2015-07-20 DIAGNOSIS — I10 Essential (primary) hypertension: Secondary | ICD-10-CM

## 2015-07-20 DIAGNOSIS — H43813 Vitreous degeneration, bilateral: Secondary | ICD-10-CM | POA: Diagnosis not present

## 2015-07-20 DIAGNOSIS — H35033 Hypertensive retinopathy, bilateral: Secondary | ICD-10-CM | POA: Diagnosis not present

## 2015-07-20 DIAGNOSIS — H353231 Exudative age-related macular degeneration, bilateral, with active choroidal neovascularization: Secondary | ICD-10-CM | POA: Diagnosis not present

## 2015-07-27 DIAGNOSIS — Z1231 Encounter for screening mammogram for malignant neoplasm of breast: Secondary | ICD-10-CM | POA: Diagnosis not present

## 2015-08-31 ENCOUNTER — Encounter (INDEPENDENT_AMBULATORY_CARE_PROVIDER_SITE_OTHER): Payer: Medicare Other | Admitting: Ophthalmology

## 2015-08-31 DIAGNOSIS — I10 Essential (primary) hypertension: Secondary | ICD-10-CM | POA: Diagnosis not present

## 2015-08-31 DIAGNOSIS — H353231 Exudative age-related macular degeneration, bilateral, with active choroidal neovascularization: Secondary | ICD-10-CM | POA: Diagnosis not present

## 2015-08-31 DIAGNOSIS — H34831 Tributary (branch) retinal vein occlusion, right eye, with macular edema: Secondary | ICD-10-CM

## 2015-08-31 DIAGNOSIS — H35033 Hypertensive retinopathy, bilateral: Secondary | ICD-10-CM

## 2015-08-31 DIAGNOSIS — H43813 Vitreous degeneration, bilateral: Secondary | ICD-10-CM

## 2015-10-10 ENCOUNTER — Encounter (INDEPENDENT_AMBULATORY_CARE_PROVIDER_SITE_OTHER): Payer: Medicare Other | Admitting: Ophthalmology

## 2015-10-10 DIAGNOSIS — H43813 Vitreous degeneration, bilateral: Secondary | ICD-10-CM

## 2015-10-10 DIAGNOSIS — H353231 Exudative age-related macular degeneration, bilateral, with active choroidal neovascularization: Secondary | ICD-10-CM | POA: Diagnosis not present

## 2015-10-10 DIAGNOSIS — H35033 Hypertensive retinopathy, bilateral: Secondary | ICD-10-CM | POA: Diagnosis not present

## 2015-10-10 DIAGNOSIS — H34831 Tributary (branch) retinal vein occlusion, right eye, with macular edema: Secondary | ICD-10-CM

## 2015-10-10 DIAGNOSIS — I10 Essential (primary) hypertension: Secondary | ICD-10-CM | POA: Diagnosis not present

## 2015-10-17 DIAGNOSIS — E7800 Pure hypercholesterolemia, unspecified: Secondary | ICD-10-CM | POA: Diagnosis not present

## 2015-10-17 DIAGNOSIS — R51 Headache: Secondary | ICD-10-CM | POA: Diagnosis not present

## 2015-10-17 DIAGNOSIS — M150 Primary generalized (osteo)arthritis: Secondary | ICD-10-CM | POA: Diagnosis not present

## 2015-10-17 DIAGNOSIS — I10 Essential (primary) hypertension: Secondary | ICD-10-CM | POA: Diagnosis not present

## 2015-11-10 DIAGNOSIS — H353211 Exudative age-related macular degeneration, right eye, with active choroidal neovascularization: Secondary | ICD-10-CM | POA: Diagnosis not present

## 2015-11-10 DIAGNOSIS — H3581 Retinal edema: Secondary | ICD-10-CM | POA: Diagnosis not present

## 2015-11-10 DIAGNOSIS — H353122 Nonexudative age-related macular degeneration, left eye, intermediate dry stage: Secondary | ICD-10-CM | POA: Diagnosis not present

## 2015-12-12 ENCOUNTER — Encounter (INDEPENDENT_AMBULATORY_CARE_PROVIDER_SITE_OTHER): Payer: Medicare Other | Admitting: Ophthalmology

## 2015-12-12 DIAGNOSIS — H35033 Hypertensive retinopathy, bilateral: Secondary | ICD-10-CM | POA: Diagnosis not present

## 2015-12-12 DIAGNOSIS — H353231 Exudative age-related macular degeneration, bilateral, with active choroidal neovascularization: Secondary | ICD-10-CM

## 2015-12-12 DIAGNOSIS — I10 Essential (primary) hypertension: Secondary | ICD-10-CM | POA: Diagnosis not present

## 2015-12-12 DIAGNOSIS — H43813 Vitreous degeneration, bilateral: Secondary | ICD-10-CM | POA: Diagnosis not present

## 2015-12-12 DIAGNOSIS — T1511XA Foreign body in conjunctival sac, right eye, initial encounter: Secondary | ICD-10-CM | POA: Diagnosis not present

## 2015-12-19 DIAGNOSIS — T1511XA Foreign body in conjunctival sac, right eye, initial encounter: Secondary | ICD-10-CM | POA: Diagnosis not present

## 2015-12-26 ENCOUNTER — Encounter (HOSPITAL_COMMUNITY): Payer: Self-pay | Admitting: Emergency Medicine

## 2015-12-26 ENCOUNTER — Ambulatory Visit (HOSPITAL_COMMUNITY)
Admission: EM | Admit: 2015-12-26 | Discharge: 2015-12-26 | Disposition: A | Discharge status: Home / Self Care | Payer: Medicare Other | Attending: Family Medicine | Admitting: Family Medicine

## 2015-12-26 DIAGNOSIS — J069 Acute upper respiratory infection, unspecified: Secondary | ICD-10-CM

## 2015-12-26 MED ORDER — AMOXICILLIN 500 MG PO CAPS: 500.0000 mg | ORAL_CAPSULE | Freq: Three times a day (TID) | ORAL | Status: DC

## 2015-12-26 NOTE — ED Notes (Signed)
Patient c/o uri symptoms x 2 days. Has been having a productive cough with brownish phlegm, shortness of breath, lack of appetite, and hot flashes. Patient is in NAD.

## 2015-12-26 NOTE — ED Provider Notes (Signed)
CSN: [id number]     Arrival date & time 12/26/15  1400 History   None    No chief complaint on file.  (Consider location/radiation/quality/duration/timing/severity/associated sxs/prior Treatment) HPI History obtained from patient:  Pt presents with the cc of:  Cold symptoms Duration of symptoms: at least 1 week Treatment prior to arrival: OTC medications Context: sudden onset Other symptoms include: none Pain score: 2 FAMILY HISTORY: stroke-mother    Past Medical History  Diagnosis Date  . HTN (hypertension)   . Hyperlipidemia   . Leukopenia     chronic. hx of hem consult years ago  . Hypersomnia with sleep apnea     on CPAP  . Asthma   . GERD (gastroesophageal reflux disease)     EGD 10 99 and 7 00  . ALLERGIC RHINITIS   . Diverticulitis      1 perf colon 10 99  . Anxiety   . Anemia     with borderline low wbc and neg heme eval 10 years ago.   . Syncope 2010    echo ok. cards consult    . Multinodular goiter 2011    get yearly [place]  . Cataract   . Callus   . Corns and callosities   . Wears glasses    Past Surgical History  Procedure Laterality Date  . Total abdominal hysterectomy    . Cataract extraction    . Foot surgery Right 1990's     Hammer toe surgery  . Carpal tunnel release Right 06/29/2013    Procedure: RIGHT CARPAL TUNNEL RELEASE;  Surgeon: Tennis Must, MD;  Location: [facility];  Service: Orthopedics;  Laterality: Right;   Family History  Problem Relation Age of Onset  . Coronary artery disease Mother   . Coronary artery disease Father   . Stroke Sister   . Colon cancer Neg Hx    Social History  Substance Use Topics  . Smoking status: Never Smoker   . Smokeless tobacco: Never Used  . Alcohol Use: No   OB History    No data available     Review of Systems  Denies: HEADACHE, NAUSEA, ABDOMINAL PAIN, CHEST PAIN, CONGESTION, DYSURIA, SHORTNESS OF BREATH  Allergies  Azithromycin; Propoxyphene n-acetaminophen; and  Simvastatin  Home Medications   Prior to Admission medications   Medication Sig Start Date End Date Taking? Authorizing Provider  amLODipine (NORVASC) 5 MG tablet Take 5 mg by mouth daily.    Historical Provider, MD  aspirin 81 MG tablet Take 81 mg by mouth daily.      Historical Provider, MD  cholecalciferol (VITAMIN D-400) 400 UNIT TABS Take 400 Units by mouth daily.      Historical Provider, MD  HYDROcodone-acetaminophen [facility]) 5-325 MG per tablet 1-2 tabs po q6 hours prn pain 06/29/13   Leanora Cover, MD  losartan (COZAAR) 50 MG tablet TAKE ONE-HALF TABLET BY MOUTH EVERY DAY 05/27/12   Burnis Medin, MD  Multiple Vitamin (MULTIVITAMIN) capsule      Historical Provider, MD  NON FORMULARY CPAP machine.  Use as directed at bedtime.    Historical Provider, MD  omeprazole (PRILOSEC OTC) 20 MG tablet 20 mg.     Historical Provider, MD   Meds Ordered and Administered this Visit  Medications - No data to display  BP 143/66 mmHg  Pulse 62  Temp(Src) 98.6 F (37 C)  Resp 16  SpO2 99% No data found.   Physical Exam NURSES NOTES AND VITAL SIGNS REVIEWED. CONSTITUTIONAL:  Well developed, well nourished, no acute distress HEENT: normocephalic, atraumatic EYES: Conjunctiva normal NECK:normal ROM, supple, no adenopathy PULMONARY:No respiratory distress, normal effort, no crackles or wheezes ABDOMINAL: Soft, ND, NT BS+, No CVAT MUSCULOSKELETAL: Normal ROM of all extremities,  SKIN: warm and dry without rash PSYCHIATRIC: Mood and affect, behavior are normal  ED Course  Procedures (including critical care time)  Labs Review Labs Reviewed - No data to display  Imaging Review No results found.   Visual Acuity Review  Right Eye Distance:   Left Eye Distance:   Bilateral Distance:    Right Eye Near:   Left Eye Near:    Bilateral Near:       Amoxil   MDM   1. Acute URI     Patient is reassured that there are no issues that require transfer to higher level of care at this  time or additional tests. Patient is advised to continue home symptomatic treatment. Patient is advised that if there are new or worsening symptoms to attend the emergency department, contact primary care provider, or return to UC. Instructions of care provided discharged home in stable condition.    THIS NOTE WAS GENERATED USING A VOICE RECOGNITION SOFTWARE PROGRAM. ALL REASONABLE EFFORTS  WERE MADE TO PROOFREAD THIS DOCUMENT FOR ACCURACY.  I have verbally reviewed the discharge instructions with the patient. A printed AVS was given to the patient.  All questions were answered prior to discharge.      Konrad Felix, [place] 12/26/15 1736

## 2015-12-26 NOTE — Discharge Instructions (Signed)
Upper Respiratory Infection, Adult Most upper respiratory infections (URIs) are a viral infection of the air passages leading to the lungs. A URI affects the nose, throat, and upper air passages. The most common type of URI is nasopharyngitis and is typically referred to as "the common cold." URIs run their course and usually go away on their own. Most of the time, a URI does not require medical attention, but sometimes a bacterial infection in the upper airways can follow a viral infection. This is called a secondary infection. Sinus and middle ear infections are common types of secondary upper respiratory infections. Bacterial pneumonia can also complicate a URI. A URI can worsen asthma and chronic obstructive pulmonary disease (COPD). Sometimes, these complications can require emergency medical care and may be life threatening.  CAUSES Almost all URIs are caused by viruses. A virus is a type of germ and can spread from one person to another.  RISKS FACTORS You may be at risk for a URI if:   You smoke.   You have chronic heart or lung disease.  You have a weakened defense (immune) system.   You are very young or very old.   You have nasal allergies or asthma.  You work in crowded or poorly ventilated areas.  You work in health care facilities or schools. SIGNS AND SYMPTOMS  Symptoms typically develop 2-3 days after you come in contact with a cold virus. Most viral URIs last 7-10 days. However, viral URIs from the influenza virus (flu virus) can last 14-18 days and are typically more severe. Symptoms may include:   Runny or stuffy (congested) nose.   Sneezing.   Cough.   Sore throat.   Headache.   Fatigue.   Fever.   Loss of appetite.   Pain in your forehead, behind your eyes, and over your cheekbones (sinus pain).  Muscle aches.  DIAGNOSIS  Your health care provider may diagnose a URI by:  Physical exam.  Tests to check that your symptoms are not due to  another condition such as:  Strep throat.  Sinusitis.  Pneumonia.  Asthma. TREATMENT  A URI goes away on its own with time. It cannot be cured with medicines, but medicines may be prescribed or recommended to relieve symptoms. Medicines may help:  Reduce your fever.  Reduce your cough.  Relieve nasal congestion. HOME CARE INSTRUCTIONS   Take medicines only as directed by your health care provider.   Gargle warm saltwater or take cough drops to comfort your throat as directed by your health care provider.  Use a warm mist humidifier or inhale steam from a shower to increase air moisture. This may make it easier to breathe.  Drink enough fluid to keep your urine clear or pale yellow.   Eat soups and other clear broths and maintain good nutrition.   Rest as needed.   Return to work when your temperature has returned to normal or as your health care provider advises. You may need to stay home longer to avoid infecting others. You can also use a face mask and careful hand washing to prevent spread of the virus.  Increase the usage of your inhaler if you have asthma.   Do not use any tobacco products, including cigarettes, chewing tobacco, or electronic cigarettes. If you need help quitting, ask your health care provider. PREVENTION  The best way to protect yourself from getting a cold is to practice good hygiene.   Avoid oral or hand contact with people with cold   symptoms.   Wash your hands often if contact occurs.  There is no clear evidence that vitamin C, vitamin E, echinacea, or exercise reduces the chance of developing a cold. However, it is always recommended to get plenty of rest, exercise, and practice good nutrition.  SEEK MEDICAL CARE IF:   You are getting worse rather than better.   Your symptoms are not controlled by medicine.   You have chills.  You have worsening shortness of breath.  You have brown or red mucus.  You have yellow or brown nasal  discharge.  You have pain in your face, especially when you bend forward.  You have a fever.  You have swollen neck glands.  You have pain while swallowing.  You have white areas in the back of your throat. SEEK IMMEDIATE MEDICAL CARE IF:   You have severe or persistent:  Headache.  Ear pain.  Sinus pain.  Chest pain.  You have chronic lung disease and any of the following:  Wheezing.  Prolonged cough.  Coughing up blood.  A change in your usual mucus.  You have a stiff neck.  You have changes in your:  Vision.  Hearing.  Thinking.  Mood. MAKE SURE YOU:   Understand these instructions.  Will watch your condition.  Will get help right away if you are not doing well or get worse.   This information is not intended to replace advice given to you by your health care provider. Make sure you discuss any questions you have with your health care provider.   Document Released: 01/09/2001 Document Revised: 11/30/2014 Document Reviewed: 10/21/2013 Elsevier Interactive Patient Education 2016 Elsevier Inc.  

## 2015-12-28 ENCOUNTER — Encounter (INDEPENDENT_AMBULATORY_CARE_PROVIDER_SITE_OTHER): Payer: Medicare Other | Admitting: Ophthalmology

## 2015-12-28 DIAGNOSIS — H43813 Vitreous degeneration, bilateral: Secondary | ICD-10-CM | POA: Diagnosis not present

## 2015-12-28 DIAGNOSIS — H35033 Hypertensive retinopathy, bilateral: Secondary | ICD-10-CM

## 2015-12-28 DIAGNOSIS — I10 Essential (primary) hypertension: Secondary | ICD-10-CM

## 2015-12-28 DIAGNOSIS — H353231 Exudative age-related macular degeneration, bilateral, with active choroidal neovascularization: Secondary | ICD-10-CM

## 2016-01-16 DIAGNOSIS — M150 Primary generalized (osteo)arthritis: Secondary | ICD-10-CM | POA: Diagnosis not present

## 2016-01-16 DIAGNOSIS — G4730 Sleep apnea, unspecified: Secondary | ICD-10-CM | POA: Diagnosis not present

## 2016-01-16 DIAGNOSIS — I10 Essential (primary) hypertension: Secondary | ICD-10-CM | POA: Diagnosis not present

## 2016-01-16 DIAGNOSIS — E7800 Pure hypercholesterolemia, unspecified: Secondary | ICD-10-CM | POA: Diagnosis not present

## 2016-02-13 ENCOUNTER — Telehealth: Payer: Self-pay | Admitting: Emergency Medicine

## 2016-02-13 NOTE — Telephone Encounter (Signed)
Please advise.     PLEASE NOTE: All timestamps contained within this report are represented as [place] Standard Time. CONFIDENTIALTY NOTICE: This fax transmission is intended only for the addressee. It contains information that is legally privileged, confidential or otherwise protected from use or disclosure. If you are not the intended recipient, you are strictly prohibited from reviewing, disclosing, copying using or disseminating any of this information or taking any action in reliance on or regarding this information. If you have received this fax in error, please notify us immediately by telephone so that we can arrange for its return to [place]. Phone: [phone], Toll-Free: [phone], Fax: [phone] Page: 1 of 1 Call Id: [id number] [place] Day - Client Nonclinical Telephone Record [place] Day - Client Client Site [place] - Day Physician Shanon Ace - MD Contact Type Call Who Is Calling Patient / Member / Family / Caregiver Caller Name Berger Phone Number [phone] Patient Name Cassandra English Call Type Message Only Information Provided Reason for Call Request for General Office Information Initial Comment UPDATED: Caller states pt has oxygen concentrator. Was advised to stop using after eye surgery. Caller thinks [facility] needs to come to home to help her get oxygen concentrator back on. [facility] is waiting on a dr rx. Please call back. Secondary number is [phone] Additional Comment Pt DOB 22-Dec-1926 Call Closed By: Sherilyn Dacosta Transaction Date/Time: 02/13/2016 12:35:43 PM (ET)

## 2016-02-13 NOTE — Telephone Encounter (Signed)
Please refer this message  to pulmonary Dr Elsworth Soho  Who s is managing  Ordering her  Respiratory meds and O2 . I have not seen patient since 2013 .

## 2016-02-14 ENCOUNTER — Telehealth: Payer: Self-pay | Admitting: Pulmonary Disease

## 2016-02-14 NOTE — Telephone Encounter (Signed)
Spoke with Kerin Salen and she states that she is a friend of the pt (as well as a pt here) and she was wanting help with pt's CPAP machine. Advised Ms. Bumper that since she is not listed on pt's contact list that we cannot discuss pt's care with her. She voiced understanding. Advised caller that she may want to call [facility] for assistance with CPAP machine. She states they have called [facility] and were advised they needed new CPAP order. Advised caller that pt needs OV to discuss. She will contact pt and let her know. Nothing further needed at this time.

## 2016-02-14 NOTE — Telephone Encounter (Signed)
Called spoke with pt. appt scheduled for 7/26 at 9:30 w/ TP. Pt aware this is [place] office. Nothing further eneded

## 2016-02-14 NOTE — Telephone Encounter (Signed)
Have not seen this patient in 3 years Please arrange office visit with me/ NP

## 2016-02-22 ENCOUNTER — Ambulatory Visit (INDEPENDENT_AMBULATORY_CARE_PROVIDER_SITE_OTHER): Payer: Medicare Other | Admitting: Adult Health

## 2016-02-22 ENCOUNTER — Encounter: Payer: Self-pay | Admitting: Adult Health

## 2016-02-22 VITALS — BP 118/78 | HR 67 | Temp 97.9°F | Ht 63.0 in | Wt 133.0 lb

## 2016-02-22 DIAGNOSIS — G4733 Obstructive sleep apnea (adult) (pediatric): Secondary | ICD-10-CM

## 2016-02-22 NOTE — Progress Notes (Signed)
Subjective:    Patient ID: Cassandra English, female    DOB: 1927-06-05, 82 y.o.   MRN: [id number]  HPI  Primary Provider - Burnis Medin MD   [name] for FU of mild obstructive sleep apnea -improved with CPAP.  PSG >> AHI 10.9, predominant hypopneas, REM 17/h  CPAP download 08/04/07- 2/13 >> good compliance, avg pressure 10 cm on auto 5-15  download 1/5 to 09/12/07 , 65% comlpiance, cpap 10  02/22/2016 Follow up : OSA  Pt returns for follow up for OSA .  Last seen in 04/2013.  She has mild OSA , previously on CPAP .  She has not worn for couple of years. She has had an eye condition and was not able to wear due to this.  Now wants to get restarted  Has increased daytime sleepiness. Restless during night with sleeping.  Previously on CPAP at 10cm.  Denies chest pain, orthopnea, or edema.     Past Medical History:  Diagnosis Date  . ALLERGIC RHINITIS   . Anemia    with borderline low wbc and neg heme eval 10 years ago.   Marland Kitchen Anxiety   . Asthma   . Callus   . Cataract   . Corns and callosities   . Diverticulitis     1 perf colon 10 99  . GERD (gastroesophageal reflux disease)    EGD 10 99 and 7 00  . HTN (hypertension)   . Hyperlipidemia   . Hypersomnia with sleep apnea    on CPAP  . Leukopenia    chronic. hx of hem consult years ago  . Multinodular goiter 2011   get yearly [place]  . Syncope 2010   echo ok. cards consult    . Wears glasses    Current Outpatient Prescriptions on File Prior to Visit  Medication Sig Dispense Refill  . aspirin 81 MG tablet Take 81 mg by mouth daily.      . cholecalciferol (VITAMIN D-400) 400 UNIT TABS Take 400 Units by mouth daily.      Marland Kitchen losartan (COZAAR) 50 MG tablet TAKE ONE-HALF TABLET BY MOUTH EVERY DAY 30 tablet 5  . Multiple Vitamin (MULTIVITAMIN) capsule      . NON FORMULARY CPAP machine.  Use as directed at bedtime.    Marland Kitchen amLODipine (NORVASC) 5 MG tablet Take 5 mg by mouth daily.    Marland Kitchen HYDROcodone-acetaminophen (NORCO) 5-325 MG per  tablet 1-2 tabs po q6 hours prn pain (Patient not taking: Reported on 02/22/2016) 30 tablet 0  . omeprazole (PRILOSEC OTC) 20 MG tablet 20 mg.      No current facility-administered medications on file prior to visit.       Review of Systems  neg for any significant sore throat, dysphagia, itching, sneezing, nasal congestion or excess/ purulent secretions, fever, chills, sweats, unintended wt loss, pleuritic or exertional cp, hempoptysis, orthopnea pnd or change in chronic leg swelling. Also denies presyncope, palpitations, heartburn, abdominal pain, nausea, vomiting, diarrhea or change in bowel or urinary habits, dysuria,hematuria, rash, arthralgias, visual complaints, headache, numbness weakness or ataxia.     Objective:   Physical Exam Vitals:   02/22/16 1021  BP: 118/78  Pulse: 67  Temp: 97.9 F (36.6 C)  TempSrc: Oral  SpO2: 99%  Weight: 133 lb (60.3 kg)  Height: 5\' 3"  (1.6 m)  Body mass index is 23.56 kg/m.   Gen. Pleasant, well-nourished, in no distress ENT - no lesions, no post nasal drip, Class 2 MP airway  Neck: No JVD, no thyromegaly, no carotid bruits Lungs: no use of accessory muscles, no dullness to percussion, clear without rales or rhonchi  Cardiovascular: Rhythm regular, heart sounds  normal, no murmurs or gallops, no peripheral edema Musculoskeletal: No deformities, no cyanosis or clubbing    Sumit Branham NP-C  [facility]  02/22/2016

## 2016-02-22 NOTE — Patient Instructions (Signed)
Restart CPAP At bedtime   Order for new supplies  Do not drive if sleepy.  Follow up in 6-8 weeks with Dr. Elsworth Soho  Or Audrick Lamoureaux NP

## 2016-02-22 NOTE — Assessment & Plan Note (Signed)
Mild OSA -off therapy  Will restart CPAP  Order sent to restart with new supplies   Plan  Restart CPAP At bedtime   Order for new supplies  Do not drive if sleepy.  Follow up in 6-8 weeks with Dr. Elsworth Soho  Or [place] NP

## 2016-03-05 DIAGNOSIS — R413 Other amnesia: Secondary | ICD-10-CM | POA: Diagnosis not present

## 2016-03-05 DIAGNOSIS — I10 Essential (primary) hypertension: Secondary | ICD-10-CM | POA: Diagnosis not present

## 2016-03-05 DIAGNOSIS — Z0001 Encounter for general adult medical examination with abnormal findings: Secondary | ICD-10-CM | POA: Diagnosis not present

## 2016-03-05 DIAGNOSIS — M150 Primary generalized (osteo)arthritis: Secondary | ICD-10-CM | POA: Diagnosis not present

## 2016-03-05 DIAGNOSIS — E559 Vitamin D deficiency, unspecified: Secondary | ICD-10-CM | POA: Diagnosis not present

## 2016-03-05 DIAGNOSIS — E7801 Familial hypercholesterolemia: Secondary | ICD-10-CM | POA: Diagnosis not present

## 2016-03-05 DIAGNOSIS — E7800 Pure hypercholesterolemia, unspecified: Secondary | ICD-10-CM | POA: Diagnosis not present

## 2016-03-05 DIAGNOSIS — D509 Iron deficiency anemia, unspecified: Secondary | ICD-10-CM | POA: Diagnosis not present

## 2016-04-04 ENCOUNTER — Encounter (INDEPENDENT_AMBULATORY_CARE_PROVIDER_SITE_OTHER): Payer: Medicare Other | Admitting: Ophthalmology

## 2016-04-04 DIAGNOSIS — I10 Essential (primary) hypertension: Secondary | ICD-10-CM | POA: Diagnosis not present

## 2016-04-04 DIAGNOSIS — H35033 Hypertensive retinopathy, bilateral: Secondary | ICD-10-CM

## 2016-04-04 DIAGNOSIS — H353231 Exudative age-related macular degeneration, bilateral, with active choroidal neovascularization: Secondary | ICD-10-CM | POA: Diagnosis not present

## 2016-04-04 DIAGNOSIS — H43813 Vitreous degeneration, bilateral: Secondary | ICD-10-CM

## 2016-04-16 ENCOUNTER — Ambulatory Visit (INDEPENDENT_AMBULATORY_CARE_PROVIDER_SITE_OTHER): Payer: Medicare Other | Admitting: Pulmonary Disease

## 2016-04-16 ENCOUNTER — Encounter: Payer: Self-pay | Admitting: Pulmonary Disease

## 2016-04-16 DIAGNOSIS — I10 Essential (primary) hypertension: Secondary | ICD-10-CM

## 2016-04-16 DIAGNOSIS — G4733 Obstructive sleep apnea (adult) (pediatric): Secondary | ICD-10-CM

## 2016-04-16 NOTE — Assessment & Plan Note (Addendum)
Prescription for nasal pillows will be sent to DME I demonstrated use of full face mask to her and caregiver today in the office  The cardiovascular significance of mild OSA in the elderly is debatable. If she is unable to tolerate CPAP, I would not push this any further  Advised against medications with sedative side effects Cautioned against driving when sleepy - understanding that sleepiness will vary on a day to day basis

## 2016-04-16 NOTE — Progress Notes (Signed)
   Subjective:    Patient ID: Cassandra English, female    DOB: 1926/09/21, 82 y.o.   MRN: [id number]  HPI [name] for FU of mild obstructive sleep apnea -improved with CPAP.  PSG >> AHI 10.9, predominant hypopneas, REM 17/h  CPAP download 08/04/07- 2/13 >> good compliance, avg pressure 10 cm on auto 5-15  download 1/5 to 09/12/07 , 65% comlpiance, cpap 10   04/16/2016  Chief Complaint  Patient presents with  . Follow-up    Patient has CPAP machine but does not wear it, she states that "no one told her she had to wear it" and she said that no one has ever shown her how to use it. pt complains of sinus congestion.    She was lost to follow-up since 2014, stop using CPAP due to her eye condition, was then seen by her PCP and asked to start using it again She is a full face mask and does not know how to put this on, hence did not use it. She is accompanied by her caregiver who does not stay with her at night.  Previously on CPAP at 10cm.     Review of Systems Patient denies significant dyspnea,cough, hemoptysis,  chest pain, palpitations, pedal edema, orthopnea, paroxysmal nocturnal dyspnea, lightheadedness, nausea, vomiting, abdominal or  leg pains      Objective:   Physical Exam  Gen. Pleasant, Elderly, well-nourished, in no distress ENT - no lesions, no post nasal drip Neck: No JVD, no thyromegaly, no carotid bruits Lungs: no use of accessory muscles, no dullness to percussion, clear without rales or rhonchi  Cardiovascular: Rhythm regular, heart sounds  normal, no murmurs or gallops, no peripheral edema Musculoskeletal: No deformities, no cyanosis or clubbing         Assessment & Plan:

## 2016-04-16 NOTE — Assessment & Plan Note (Signed)
appears well controlled Flu shot today

## 2016-04-16 NOTE — Patient Instructions (Signed)
Prescription for nasal pillows will be sent to DME Call us if you do not receive this or have problems using it

## 2016-04-18 DIAGNOSIS — E7800 Pure hypercholesterolemia, unspecified: Secondary | ICD-10-CM | POA: Diagnosis not present

## 2016-04-18 DIAGNOSIS — I10 Essential (primary) hypertension: Secondary | ICD-10-CM | POA: Diagnosis not present

## 2016-04-18 DIAGNOSIS — M150 Primary generalized (osteo)arthritis: Secondary | ICD-10-CM | POA: Diagnosis not present

## 2016-05-16 ENCOUNTER — Encounter (INDEPENDENT_AMBULATORY_CARE_PROVIDER_SITE_OTHER): Payer: Medicare Other | Admitting: Ophthalmology

## 2016-07-27 DIAGNOSIS — Z1231 Encounter for screening mammogram for malignant neoplasm of breast: Secondary | ICD-10-CM | POA: Diagnosis not present

## 2016-08-06 DIAGNOSIS — I10 Essential (primary) hypertension: Secondary | ICD-10-CM | POA: Diagnosis not present

## 2016-08-06 DIAGNOSIS — E7800 Pure hypercholesterolemia, unspecified: Secondary | ICD-10-CM | POA: Diagnosis not present

## 2016-08-06 DIAGNOSIS — M150 Primary generalized (osteo)arthritis: Secondary | ICD-10-CM | POA: Diagnosis not present

## 2016-08-20 DIAGNOSIS — E7800 Pure hypercholesterolemia, unspecified: Secondary | ICD-10-CM | POA: Diagnosis not present

## 2016-08-20 DIAGNOSIS — I10 Essential (primary) hypertension: Secondary | ICD-10-CM | POA: Diagnosis not present

## 2016-08-20 DIAGNOSIS — M150 Primary generalized (osteo)arthritis: Secondary | ICD-10-CM | POA: Diagnosis not present

## 2016-10-15 ENCOUNTER — Encounter: Payer: Self-pay | Admitting: Adult Health

## 2016-10-15 ENCOUNTER — Ambulatory Visit (INDEPENDENT_AMBULATORY_CARE_PROVIDER_SITE_OTHER): Payer: Medicare Other | Admitting: Adult Health

## 2016-10-15 DIAGNOSIS — G4733 Obstructive sleep apnea (adult) (pediatric): Secondary | ICD-10-CM

## 2016-10-15 NOTE — Assessment & Plan Note (Addendum)
Doing well on CPAP  Encouraged to get in at least 4-6 hr .   Plan  Patient Instructions  Continue on  CPAP At bedtime  .  Wear for at least 4hr each night .  Do not drive if sleepy.  Follow up In 1 year and As needed  With Dr. Elsworth Soho .

## 2016-10-15 NOTE — Patient Instructions (Addendum)
Continue on  CPAP At bedtime  .  Wear for at least 4hr each night .  Do not drive if sleepy.  Follow up In 1 year and As needed  With Dr. Elsworth Soho .

## 2016-10-15 NOTE — Progress Notes (Signed)
@Patient  ID: Cassandra English, female    DOB: 1927-07-01, 83 y.o.   MRN: [id number]  Chief Complaint  Patient presents with  . Follow-up    OSA    Referring provider: Burnis Medin, MD  HPI: [name] for FU of mild obstructive sleep apnea -improved with CPAP.   PSG >> AHI 10.9, predominant hypopneas, REM 17/h  CPAP download 08/04/07- 2/13 >> good compliance, avg pressure 10 cm on auto 5-15  download 1/5 to 09/12/07 , 65% comlpiance, cpap 10  10/15/2016 Follow up : OSA  Patient presents for a six-month follow-up for sleep apnea. Patient remains on C Pap at bedtime. Since last visit. Patient says she is doing okay on CPAP .  Wears each night , gets in around 4hr each night . Feels it is helping her, does not feel as tired when she wears it. Does take it off when she gets up to go to [place] and sometimes forgets to put it back on.  Unable to get Download -requested from DME.    Allergies  Allergen Reactions  . Azithromycin   . Propoxyphene N-Acetaminophen   . Simvastatin     REACTION: unspecified    Immunization History  Administered Date(s) Administered  . Influenza Split 04/03/2012  . Influenza Whole 04/14/2008, 05/02/2009, 05/01/2010  . Influenza, High Dose Seasonal PF 05/30/2016  . Influenza,inj,Quad PF,36+ Mos 04/25/2015  . Pneumococcal Polysaccharide-23 07/30/2000  . Td 07/30/1996  . Tdap 12/06/2010    Past Medical History:  Diagnosis Date  . ALLERGIC RHINITIS   . Anemia    with borderline low wbc and neg heme eval 10 years ago.   Marland Kitchen Anxiety   . Asthma   . Callus   . Cataract   . Corns and callosities   . Diverticulitis     1 perf colon 10 99  . GERD (gastroesophageal reflux disease)    EGD 10 99 and 7 00  . HTN (hypertension)   . Hyperlipidemia   . Hypersomnia with sleep apnea    on CPAP  . Leukopenia    chronic. hx of hem consult years ago  . Multinodular goiter 2011   get yearly [place]  . Syncope 2010   echo ok. cards consult    . Wears glasses      Tobacco History: History  Smoking Status  . Never Smoker  Smokeless Tobacco  . Never Used   Counseling given: Not Answered   Outpatient Encounter Prescriptions as of 10/15/2016  Medication Sig  . amLODipine (NORVASC) 5 MG tablet Take 5 mg by mouth daily.  Marland Kitchen aspirin 81 MG tablet Take 81 mg by mouth daily.    . cholecalciferol (VITAMIN D-400) 400 UNIT TABS Take 400 Units by mouth daily.    Marland Kitchen losartan (COZAAR) 50 MG tablet TAKE ONE-HALF TABLET BY MOUTH EVERY DAY  . Multiple Vitamin (MULTIVITAMIN) capsule    . NON FORMULARY CPAP machine.  Use as directed at bedtime.  Marland Kitchen omeprazole (PRILOSEC OTC) 20 MG tablet 20 mg.   . [DISCONTINUED] HYDROcodone-acetaminophen (NORCO) 5-325 MG per tablet 1-2 tabs po q6 hours prn pain (Patient not taking: Reported on 10/15/2016)   No facility-administered encounter medications on file as of 10/15/2016.      Review of Systems  Constitutional:   No  weight loss, night sweats,  Fevers, chills, fatigue, or  lassitude.  HEENT:   No headaches,  Difficulty swallowing,  Tooth/dental problems, or  Sore throat,  No sneezing, itching, ear ache, nasal congestion, post nasal drip,   CV:  No chest pain,  Orthopnea, PND, swelling in lower extremities, anasarca, dizziness, palpitations, syncope.   GI  No heartburn, indigestion, abdominal pain, nausea, vomiting, diarrhea, change in bowel habits, loss of appetite, bloody stools.   Resp: No shortness of breath with exertion or at rest.  No excess mucus, no productive cough,  No non-productive cough,  No coughing up of blood.  No change in color of mucus.  No wheezing.  No chest wall deformity  Skin: no rash or lesions.  GU: no dysuria, change in color of urine, no urgency or frequency.  No flank pain, no hematuria   MS:  No joint pain or swelling.  No decreased range of motion.  No back pain.    Physical Exam  BP 122/74 (BP Location: Left Arm, Cuff Size: Normal)   Pulse 79   Ht 5\' 6"  (1.676  m)   Wt 135 lb (61.2 kg)   SpO2 98%   BMI 21.79 kg/m   GEN: A/Ox3; pleasant , NAD elderly    HEENT:  [place]/AT,  EACs-clear, TMs-wnl, NOSE-clear, THROAT-clear, no lesions, no postnasal drip or exudate noted. Class 2 MP airway   NECK:  Supple w/ fair ROM; no JVD; normal carotid impulses w/o bruits; no thyromegaly or nodules palpated; no lymphadenopathy.    RESP  Clear  P & A; w/o, wheezes/ rales/ or rhonchi. no accessory muscle use, no dullness to percussion  CARD:  RRR, no m/r/g, no peripheral edema, pulses intact, no cyanosis or clubbing.  GI:   Soft & nt; nml bowel sounds; no organomegaly or masses detected.   Musco: Warm bil, no deformities or joint swelling noted.   Neuro: alert, no focal deficits noted.    Skin: Warm, no lesions or rashes    Lab Results:  CBC  BNP No results found for: BNP  ProBNP No results found for: PROBNP  Imaging: No results found.   Assessment & Plan:   OSA (obstructive sleep apnea) Doing well on CPAP  Encouraged to get in at least 4-6 hr .   Plan  Patient Instructions  Continue on  CPAP At bedtime  .  Wear for at least 4hr each night .  Do not drive if sleepy.  Follow up In 1 year and As needed  With Dr. Elsworth Soho .         Rexene Edison, NP 10/15/2016

## 2016-10-29 DIAGNOSIS — M150 Primary generalized (osteo)arthritis: Secondary | ICD-10-CM | POA: Diagnosis not present

## 2016-10-29 DIAGNOSIS — I10 Essential (primary) hypertension: Secondary | ICD-10-CM | POA: Diagnosis not present

## 2016-10-29 DIAGNOSIS — E7800 Pure hypercholesterolemia, unspecified: Secondary | ICD-10-CM | POA: Diagnosis not present

## 2016-12-04 ENCOUNTER — Telehealth: Payer: Self-pay | Admitting: Pulmonary Disease

## 2016-12-04 DIAGNOSIS — G4733 Obstructive sleep apnea (adult) (pediatric): Secondary | ICD-10-CM

## 2016-12-04 NOTE — Telephone Encounter (Signed)
Placed forms in RA's lookat folder.

## 2016-12-04 NOTE — Telephone Encounter (Signed)
Will forward message to Bailey to follow up on forms that were left in RA folder up front.

## 2016-12-05 NOTE — Telephone Encounter (Signed)
Spoke with patient, she states she wants to see if she can get a CPAP machine from [facility]. She last saw TP on 10/15/16 was told to follow up with you in a year.   Is it ok to place an order for her? Thanks!

## 2016-12-05 NOTE — Telephone Encounter (Signed)
Okay to send prescription for CPAP 10 cm

## 2016-12-06 NOTE — Telephone Encounter (Signed)
Order has been placed for new machine. Patient is aware. Nothing else is needed.

## 2016-12-13 DIAGNOSIS — E7800 Pure hypercholesterolemia, unspecified: Secondary | ICD-10-CM | POA: Diagnosis not present

## 2016-12-13 DIAGNOSIS — I10 Essential (primary) hypertension: Secondary | ICD-10-CM | POA: Diagnosis not present

## 2016-12-13 DIAGNOSIS — M150 Primary generalized (osteo)arthritis: Secondary | ICD-10-CM | POA: Diagnosis not present

## 2016-12-19 ENCOUNTER — Telehealth: Payer: Self-pay | Admitting: Pulmonary Disease

## 2016-12-19 NOTE — Telephone Encounter (Signed)
LM x 1 for Melissa/[facility]

## 2016-12-20 NOTE — Telephone Encounter (Signed)
lmtcb x2 for Melissa with [facility] 

## 2016-12-21 NOTE — Telephone Encounter (Signed)
I have called and spoke with Melissa from [facility]----she stated that they still have not heard from the pt. Pt will need to call [phone] ext [phone] to reschedule.  I attempted to call the pt but the VM is not set up.

## 2016-12-25 NOTE — Telephone Encounter (Signed)
Called and spoke with pt  And she was given the number to [facility] to call and get this scheduled for her cpap.

## 2017-02-01 ENCOUNTER — Encounter: Payer: Self-pay | Admitting: Adult Health

## 2017-02-01 ENCOUNTER — Ambulatory Visit (INDEPENDENT_AMBULATORY_CARE_PROVIDER_SITE_OTHER): Payer: Medicare Other | Admitting: Adult Health

## 2017-02-01 DIAGNOSIS — G4733 Obstructive sleep apnea (adult) (pediatric): Secondary | ICD-10-CM

## 2017-02-01 NOTE — Patient Instructions (Signed)
Try to wear CPAP At bedtime - try to get in at least 4hr each night  Get your family to help with CPAP .  Follow up In 3 months With Dr. Elsworth Soho . And As needed

## 2017-02-01 NOTE — Progress Notes (Signed)
@Patient  ID: Cassandra English, female    DOB: 09/30/1926, 83 y.o.   MRN: [id number]  Chief Complaint  Patient presents with  . Follow-up    OSA    Referring provider: Burnis Medin, MD  HPI: [phone] for FU of mild obstructive sleep apnea -improved with CPAP.   PSG >>AHI 10.9, predominant hypopneas, REM 17/h  CPAP download 08/04/07- 2/13 >>good compliance, avg pressure 10 cm on auto 5-15  download 1/5 to 09/12/07 , 65% comlpiance, cpap 10  02/01/2017 Follow up : OSA  Patient returns for follow-up for sleep apnea. Patient recently got a new C Pap machine. Says she does okay on CPAP for a night or two. Then she forgets how to use the CPAP . says her memory is getting bad. She has went to DME several times to help her with set up but she forgets how the CPAP works after a few days.  Download shows poor compliance w/ rara usage.  Lives by herself . She believes she needs someone to help her at night . She does not have any family to come over and help her.  Her grandson is with her today, we discussed her issues. He says he can help her with this.     Allergies  Allergen Reactions  . Azithromycin   . Propoxyphene N-Acetaminophen   . Simvastatin     REACTION: unspecified    Immunization History  Administered Date(s) Administered  . Influenza Split 04/03/2012  . Influenza Whole 04/14/2008, 05/02/2009, 05/01/2010  . Influenza, High Dose Seasonal PF 05/30/2016  . Influenza,inj,Quad PF,36+ Mos 04/25/2015  . Pneumococcal Polysaccharide-23 07/30/2000  . Td 07/30/1996  . Tdap 12/06/2010    Past Medical History:  Diagnosis Date  . ALLERGIC RHINITIS   . Anemia    with borderline low wbc and neg heme eval 10 years ago.   Marland Kitchen Anxiety   . Asthma   . Callus   . Cataract   . Corns and callosities   . Diverticulitis     1 perf colon 10 99  . GERD (gastroesophageal reflux disease)    EGD 10 99 and 7 00  . HTN (hypertension)   . Hyperlipidemia   . Hypersomnia with sleep apnea    on CPAP  . Leukopenia    chronic. hx of hem consult years ago  . Multinodular goiter 2011   get yearly [place]  . Syncope 2010   echo ok. cards consult    . Wears glasses     Tobacco History: History  Smoking Status  . Never Smoker  Smokeless Tobacco  . Never Used   Counseling given: Not Answered   Outpatient Encounter Prescriptions as of 02/01/2017  Medication Sig  . amLODipine (NORVASC) 5 MG tablet Take 5 mg by mouth daily.  Marland Kitchen aspirin 81 MG tablet Take 81 mg by mouth daily.    . cholecalciferol (VITAMIN D-400) 400 UNIT TABS Take 400 Units by mouth daily.    Marland Kitchen losartan (COZAAR) 50 MG tablet TAKE ONE-HALF TABLET BY MOUTH EVERY DAY  . Multiple Vitamin (MULTIVITAMIN) capsule    . NON FORMULARY CPAP machine.  Use as directed at bedtime.  Marland Kitchen omeprazole (PRILOSEC OTC) 20 MG tablet 20 mg.    No facility-administered encounter medications on file as of 02/01/2017.      Review of Systems  Constitutional:   No  weight loss, night sweats,  Fevers, chills, fatigue, or  lassitude.  HEENT:   No headaches,  Difficulty swallowing,  Tooth/dental problems, or  Sore throat,                No sneezing, itching, ear ache, nasal congestion, post nasal drip,   CV:  No chest pain,  Orthopnea, PND, swelling in lower extremities, anasarca, dizziness, palpitations, syncope.   GI  No heartburn, indigestion, abdominal pain, nausea, vomiting, diarrhea, change in bowel habits, loss of appetite, bloody stools.   Resp: No shortness of breath with exertion or at rest.  No excess mucus, no productive cough,  No non-productive cough,  No coughing up of blood.  No change in color of mucus.  No wheezing.  No chest wall deformity  Skin: no rash or lesions.  GU: no dysuria, change in color of urine, no urgency or frequency.  No flank pain, no hematuria   MS:  No joint pain or swelling.  No decreased range of motion.  No back pain.    Physical Exam  BP (!) 144/66 (BP Location: Left Arm, Cuff Size: Normal)    Pulse 62   Ht 5\' 6"  (1.676 m)   Wt 130 lb 9.6 oz (59.2 kg)   SpO2 99%   BMI 21.08 kg/m   GEN: A/Ox3; pleasant , NAD, elderly    HEENT:  [place]/AT,  EACs-clear, TMs-wnl, NOSE-clear, THROAT-clear, no lesions, no postnasal drip or exudate noted. Class 2 MP airway   NECK:  Supple w/ fair ROM; no JVD; normal carotid impulses w/o bruits; no thyromegaly or nodules palpated; no lymphadenopathy.    RESP  Clear  P & A; w/o, wheezes/ rales/ or rhonchi. no accessory muscle use, no dullness to percussion  CARD:  RRR, no m/r/g, no peripheral edema, pulses intact, no cyanosis or clubbing.  GI:   Soft & nt; nml bowel sounds; no organomegaly or masses detected.   Musco: Warm bil, no deformities or joint swelling noted.   Neuro: alert, no focal deficits noted.    Skin: Warm, no lesions or rashes    Lab Results:  BNP No results found for: BNP  ProBNP No results found for: PROBNP  Imaging: No results found.   Assessment & Plan:   OSA (obstructive sleep apnea) Difficulty using CPAP due to memory impairment  Family will try to help her .   Plan  Patient Instructions  Try to wear CPAP At bedtime - try to get in at least 4hr each night  Get your family to help with CPAP .  Follow up In 3 months With Dr. Elsworth Soho . And As needed         Rexene Edison, NP 02/01/2017

## 2017-02-01 NOTE — Assessment & Plan Note (Signed)
Difficulty using CPAP due to memory impairment  Family will try to help her .   Plan  Patient Instructions  Try to wear CPAP At bedtime - try to get in at least 4hr each night  Get your family to help with CPAP .  Follow up In 3 months With Dr. Elsworth Soho . And As needed

## 2017-02-20 DIAGNOSIS — Z961 Presence of intraocular lens: Secondary | ICD-10-CM | POA: Diagnosis not present

## 2017-03-04 DIAGNOSIS — M150 Primary generalized (osteo)arthritis: Secondary | ICD-10-CM | POA: Diagnosis not present

## 2017-03-04 DIAGNOSIS — E7800 Pure hypercholesterolemia, unspecified: Secondary | ICD-10-CM | POA: Diagnosis not present

## 2017-03-04 DIAGNOSIS — I10 Essential (primary) hypertension: Secondary | ICD-10-CM | POA: Diagnosis not present

## 2017-03-04 DIAGNOSIS — E538 Deficiency of other specified B group vitamins: Secondary | ICD-10-CM | POA: Diagnosis not present

## 2017-03-04 DIAGNOSIS — E559 Vitamin D deficiency, unspecified: Secondary | ICD-10-CM | POA: Diagnosis not present

## 2017-04-24 ENCOUNTER — Ambulatory Visit (INDEPENDENT_AMBULATORY_CARE_PROVIDER_SITE_OTHER): Payer: Medicare Other | Admitting: Podiatry

## 2017-04-24 ENCOUNTER — Encounter: Payer: Self-pay | Admitting: Podiatry

## 2017-04-24 DIAGNOSIS — M21969 Unspecified acquired deformity of unspecified lower leg: Secondary | ICD-10-CM

## 2017-04-24 DIAGNOSIS — Q828 Other specified congenital malformations of skin: Secondary | ICD-10-CM | POA: Diagnosis not present

## 2017-04-24 DIAGNOSIS — M79672 Pain in left foot: Secondary | ICD-10-CM | POA: Diagnosis not present

## 2017-04-24 DIAGNOSIS — M79671 Pain in right foot: Secondary | ICD-10-CM

## 2017-04-24 NOTE — Patient Instructions (Signed)
Seen for hypertrophic nails and painful calluses. All nails and calluses debrided. Return in 3 months or as needed.  

## 2017-04-24 NOTE — Progress Notes (Signed)
Subjective:  83 y.o. year old female patient presents complaining of pain under the ball of both feet. She was having difficulty walking due toe the pain.  Her last visit to this office was 06/17/2013. History of hammer toe surgery 4 and 5 right.  Objective: Dermatologic: T Thick plantar calluses under 5th MPJ bilateral with brown discoloration from intra demal hemorrhage.  No open wound. No associated edema or erythema.  Vascular: Dorsalis Pedis Arteries palpable on both feet. Posterior Tibial Pulses are not palpable on both.  Orthopedic: Contracted lesser digits 3rd, 4th, and 5th right, and 4th and 5th left.  Plantar flexed 5th metatarsals bilateral. Neurologic: All epicritic and tactile sensations grossly intact.   Assessment:  Plantar flexed 5th metatarsal bilateral. Pre-ulcerative plantar callus under 5th Metatarsophalangeal joint bilateral.  Difficulty walking from painful feet.  Treatment: Painful lesions debrided. Pain relieved. Return in 3 months or as needed.

## 2017-05-09 ENCOUNTER — Encounter: Payer: Self-pay | Admitting: Pulmonary Disease

## 2017-05-09 ENCOUNTER — Ambulatory Visit (INDEPENDENT_AMBULATORY_CARE_PROVIDER_SITE_OTHER): Payer: Medicare Other | Admitting: Pulmonary Disease

## 2017-05-09 VITALS — BP 138/80 | HR 80 | Ht 66.0 in | Wt 131.8 lb

## 2017-05-09 DIAGNOSIS — G4733 Obstructive sleep apnea (adult) (pediatric): Secondary | ICD-10-CM | POA: Diagnosis not present

## 2017-05-09 DIAGNOSIS — I10 Essential (primary) hypertension: Secondary | ICD-10-CM | POA: Diagnosis not present

## 2017-05-09 DIAGNOSIS — Z23 Encounter for immunization: Secondary | ICD-10-CM

## 2017-05-09 NOTE — Progress Notes (Signed)
   Subjective:    Patient ID: Cassandra English, female    DOB: 1926/08/21, 83 y.o.   MRN: [id number]  HPI  89F for FU of mild obstructive sleep apnea -improved with CPAP.   She'll obtain a new CPAP machine 11/2016. She has problems with using this machine since then. She is unable to verbalize whether it's a problem with the machine or the humidifier. She is able to use her nasal mask and does understand how to place it.  She is developing memory issues and seems to live alone. She is accompanied by her son today. She is simply overwhelmed with the technology and using the machine  We checked with her DME company and seems like they have been out once to her place and showed to use the machine. Download shows good control of events and CPAP 10 cm  Significant tests/ events reviewed  PSG 06/2007 >>AHI 10.9, predominant hypopneas, REM 17/h      Review of Systems  Positive for acid heartburn, loss of appetite, difficulty swallowing  Patient denies significant dyspnea,cough, hemoptysis,  chest pain, palpitations, pedal edema, orthopnea, paroxysmal nocturnal dyspnea, lightheadedness, nausea, vomiting, abdominal or  leg pains      Objective:   Physical Exam Gen. Pleasant, well-nourished, in no distress ENT - no thrush, no post nasal drip Neck: No JVD, no thyromegaly, no carotid bruits Lungs: no use of accessory muscles, no dullness to percussion, clear without rales or rhonchi  Cardiovascular: Rhythm regular, heart sounds  normal, no murmurs or gallops, no peripheral edema Musculoskeletal: No deformities, no cyanosis or clubbing        Assessment & Plan:

## 2017-05-09 NOTE — Assessment & Plan Note (Signed)
make an appointment with [facility] and take your machine of their so they can assess her machine and troubleshoot your problems   compliance with goal of at least 4-6 hrs every night is the expectation. Advised against medications with sedative side effects Cautioned against driving when sleepy - understanding that sleepiness will vary on a day to day basis

## 2017-05-09 NOTE — Patient Instructions (Signed)
Flu shot today. Please make an appointment with [facility] and take your machine of their so they can assess her machine and troubleshoot your problems

## 2017-05-09 NOTE — Assessment & Plan Note (Signed)
controlled 

## 2017-05-20 ENCOUNTER — Telehealth: Payer: Self-pay | Admitting: Pulmonary Disease

## 2017-05-20 NOTE — Telephone Encounter (Signed)
----- Message from Cassandra English sent at 05/20/2017  9:55 AM EDT ----- Regarding: RE: trouble shoot cpap machine Good morning.  Wanted to give you an update on this patient.  She came in to see our RT tech last week.  Below is his documentation.   "Saw Cassandra English and her grandson Cassandra English, I re-educated him on the unit as well, however he doesn't live with her and said he might be able to come by maybe twice a week to check on cpap unit and mask. She put the mask on several times for [place] with no problem except she would use the velcro straps and not the magnets to clamp it together. She stated that the green light on the water chamber would come on and then the unit wouldn't work. (there is no light on the chamber)."  Thank you Cassandra  ----- Message ----- From: Cassandra English Sent: 05/13/2017  12:17 PM To: Cassandra English Subject: RE: trouble shoot cpap machine                 Cassandra English (Son) Cassandra English [facility]) [id number]                          [phone]       Cassandra English [place])       [phone]         ----- Message ----- From: Cassandra English Sent: 05/13/2017  10:16 AM To: Cassandra Noel, MD, Cassandra English, # Subject: RE: trouble shoot cpap machine                 Cassandra English, no I can't see that information.  Can you copy and paste it to me?  We'll try to give them a call.  Thanks  ----- Message ----- From: Cassandra English Sent: 05/10/2017   5:09 PM To: Cassandra English, Cassandra Noel, MD, # Subject: RE: trouble shoot cpap machine                 Cassandra can you see there are 2 sons listed and a grandson?  Not sure how much info you can pull up.  Cassandra English ----- Message ----- From: Cassandra English Sent: 05/10/2017   4:55 PM To: Cassandra Noel, MD, Cassandra English, # Subject: RE: trouble shoot cpap machine                 Update:  My RT team attempted to contact the son listed in Cassandra English as her emergency contact and got a message  stating that  "the subscriber you have dialed is not in service."    I looked through recent telephone encounters and don't see any other phone numbers given.  Do you have any other contact information? ----- Message ----- From: Cassandra Noel, MD Sent: 05/10/2017   4:02 PM To: Cassandra English, Cassandra English, # Subject: RE: trouble shoot cpap machine                 I spoke to her son yesterday about it. Please call him and touch base with him. He said that he would bring her to your office.  This is just as I thought-her dementia is getting worse  Cassandra English   ----- Message ----- From: Cassandra English Sent: 05/10/2017   2:26 PM To: Cassandra Noel, MD, Cassandra English Subject: RE: trouble shoot cpap machine  I'm sending a staff message instead of putting a triage message in because your phones are not currently working.   Our RT team just called this patient regarding your order to troubleshoot her CPAP machine. They were quite concerned after the conversation with her.   The pt advised that she is having memory problems and can't remember how to operate her unit and can't remember where the ON button is.  She advised that she feels that the CPAP is making her go blind and is causing her eyes to bleed.  We offered to make an appt for her to bring her unit in with her son so we could check it but she advised that she would not be able to remember to come in or talk with her son about it.    We have gone out to her home 3 times to troubleshoot her unit but she is not retaining what she is taught.   Can you offer any assistance for this patient?  Thank you Cassandra English ----- Message ----- From: Cassandra English Sent: 05/09/2017  10:55 AM To: Cassandra English Subject: trouble shoot cpap machine                     Order has been put in by Dr Cassandra English.  Please advise.   Cassandra English

## 2017-05-20 NOTE — Telephone Encounter (Signed)
Please note that this patient has progressive dementia and that accounts for her problems with her CPAP machine. DME has checked and there is nothing wrong with her CPAP machine. I have enlisted her son to help her out with her machine. Please see notes below. Please forward this for note to her PCP her OSA is not very severe hence some noncompliance acceptable.

## 2017-06-17 ENCOUNTER — Telehealth: Payer: Self-pay | Admitting: Pulmonary Disease

## 2017-06-17 DIAGNOSIS — G4730 Sleep apnea, unspecified: Secondary | ICD-10-CM | POA: Diagnosis not present

## 2017-06-17 DIAGNOSIS — M150 Primary generalized (osteo)arthritis: Secondary | ICD-10-CM | POA: Diagnosis not present

## 2017-06-17 DIAGNOSIS — I10 Essential (primary) hypertension: Secondary | ICD-10-CM | POA: Diagnosis not present

## 2017-06-17 DIAGNOSIS — E7800 Pure hypercholesterolemia, unspecified: Secondary | ICD-10-CM | POA: Diagnosis not present

## 2017-06-17 NOTE — Telephone Encounter (Signed)
Pt had a question about her insurance payments. I advised her that [employer] was still paying her medical and DME bills and that there was nothing I saw on our end to think that they were not doing so. She stated a doctor asked her about her insurance and she is unable to read her mail due to her vision problems. I went through her mail with her consent and advised her that the mail from [facility] were explanation of benefits so I assured her that her insurance was not terminated. Nothing further is needed.

## 2017-10-29 ENCOUNTER — Encounter: Payer: Self-pay | Admitting: Podiatry

## 2017-10-29 ENCOUNTER — Ambulatory Visit (INDEPENDENT_AMBULATORY_CARE_PROVIDER_SITE_OTHER): Payer: Medicare Other | Admitting: Podiatry

## 2017-10-29 DIAGNOSIS — M79671 Pain in right foot: Secondary | ICD-10-CM | POA: Diagnosis not present

## 2017-10-29 DIAGNOSIS — B351 Tinea unguium: Secondary | ICD-10-CM

## 2017-10-29 DIAGNOSIS — M79672 Pain in left foot: Secondary | ICD-10-CM | POA: Diagnosis not present

## 2017-10-29 NOTE — Patient Instructions (Signed)
Seen for hypertrophic nails and painful calluses. All nails and calluses debrided. Return in 3 months or as needed.  

## 2017-10-29 NOTE — Progress Notes (Signed)
Subjective: 84 y.o. year old female patient presents complaining of painful nails and calluses. Patient requests toe nails, corns and calluses trimmed. They hurt on bottom of both feet.  Objective: Dermatologic: Thick yellow deformed nails x 10. Vascular: Pedal pulses are palpable on DP and not palpable on PT bilateral. Orthopedic: Contracted lesser digits 3, 4, 5 bilateral. Plantar flexed 5th metatarsal with painful lesions. Neurologic: All epicritic and tactile sensations grossly intact.  Assessment: Dystrophic mycotic nails x 10. Plantar calluses under 5th MPJ bilateral. Plantar flexed 5th met bilateral. Pain with ambulation.  Treatment: All mycotic nails, corns, calluses debrided.  Return in 3 months or as needed.

## 2017-11-08 ENCOUNTER — Ambulatory Visit: Payer: BLUE CROSS/BLUE SHIELD | Admitting: Adult Health

## 2017-11-13 ENCOUNTER — Other Ambulatory Visit: Payer: Self-pay

## 2017-11-13 ENCOUNTER — Ambulatory Visit (HOSPITAL_COMMUNITY)
Admission: EM | Admit: 2017-11-13 | Discharge: 2017-11-13 | Disposition: A | Discharge status: Home / Self Care | Payer: Medicare Other | Attending: Family Medicine | Admitting: Family Medicine

## 2017-11-13 ENCOUNTER — Encounter (HOSPITAL_COMMUNITY): Payer: Self-pay | Admitting: Emergency Medicine

## 2017-11-13 DIAGNOSIS — E785 Hyperlipidemia, unspecified: Secondary | ICD-10-CM | POA: Diagnosis not present

## 2017-11-13 DIAGNOSIS — D649 Anemia, unspecified: Secondary | ICD-10-CM | POA: Diagnosis not present

## 2017-11-13 DIAGNOSIS — I10 Essential (primary) hypertension: Secondary | ICD-10-CM | POA: Diagnosis not present

## 2017-11-13 DIAGNOSIS — G4730 Sleep apnea, unspecified: Secondary | ICD-10-CM | POA: Insufficient documentation

## 2017-11-13 DIAGNOSIS — J45909 Unspecified asthma, uncomplicated: Secondary | ICD-10-CM | POA: Diagnosis not present

## 2017-11-13 DIAGNOSIS — F419 Anxiety disorder, unspecified: Secondary | ICD-10-CM | POA: Diagnosis not present

## 2017-11-13 DIAGNOSIS — K219 Gastro-esophageal reflux disease without esophagitis: Secondary | ICD-10-CM | POA: Diagnosis not present

## 2017-11-13 DIAGNOSIS — D72819 Decreased white blood cell count, unspecified: Secondary | ICD-10-CM | POA: Diagnosis not present

## 2017-11-13 LAB — POCT I-STAT, CHEM 8
BUN: 19 mg/dL (ref 6–20)
Calcium, Ion: 1.16 mmol/L (ref 1.15–1.40)
Chloride: 105 mmol/L (ref 101–111)
Creatinine, Ser: 1 mg/dL (ref 0.44–1.00)
Glucose, Bld: 97 mg/dL (ref 65–99)
HEMATOCRIT: 35 % — AB (ref 36.0–46.0)
Hemoglobin: 11.9 g/dL — ABNORMAL LOW (ref 12.0–15.0)
POTASSIUM: 4.2 mmol/L (ref 3.5–5.1)
SODIUM: 142 mmol/L (ref 135–145)
TCO2: 27 mmol/L (ref 22–32)

## 2017-11-13 LAB — TSH: TSH: 1.838 u[IU]/mL (ref 0.350–4.500)

## 2017-11-13 NOTE — Discharge Instructions (Addendum)
Please start taking over the counter iron (ferrous sulfate) one tablet daily and follow up with a personal health care provider.  If you don't have a primary care provider, you can call and make an appointment with Cassandra English.

## 2017-11-13 NOTE — ED Provider Notes (Signed)
[place]   [id number] 11/13/17 Arrival Time: 78   SUBJECTIVE:  Cassandra English is a 84 y.o. female who presents to the urgent care with complaint of Bilateral finger tips intermittently numb and feeling tired for the past 2 days.  Patient's pcp(Dr. Jeanann Lewandowsky) has retired.   No nausea, vomiting, chest pain or shortness of breath.  Patient states that a doctor in the past - 30 years ago- (Dr. Sherald Barge) told her that her blood was thick and interfering with cardiac function and that she would die within 20 years.  Past Medical History:  Diagnosis Date  . ALLERGIC RHINITIS   . Anemia    with borderline low wbc and neg heme eval 10 years ago.   Marland Kitchen Anxiety   . Asthma   . Callus   . Cataract   . Corns and callosities   . Diverticulitis     1 perf colon 10 99  . GERD (gastroesophageal reflux disease)    EGD 10 99 and 7 00  . HTN (hypertension)   . Hyperlipidemia   . Hypersomnia with sleep apnea    on CPAP  . Leukopenia    chronic. hx of hem consult years ago  . Multinodular goiter 2011   get yearly [place]  . Syncope 2010   echo ok. cards consult    . Wears glasses    Family History  Problem Relation Age of Onset  . Coronary artery disease Mother   . Coronary artery disease Father   . Stroke Sister   . Colon cancer Neg Hx    Social History   Socioeconomic History  . Marital status: Divorced    Spouse name: Not on file  . Number of children: 3  . Years of education: Not on file  . Highest education level: Not on file  Occupational History  . Occupation: Retired   Scientific laboratory technician  . Financial resource strain: Not on file  . Food insecurity:    Worry: Not on file    Inability: Not on file  . Transportation needs:    Medical: Not on file    Non-medical: Not on file  Tobacco Use  . Smoking status: Never Smoker  . Smokeless tobacco: Never Used  Substance and Sexual Activity  . Alcohol use: No  . Drug use: No  . Sexual activity: Not on file    Lifestyle  . Physical activity:    Days per week: Not on file    Minutes per session: Not on file  . Stress: Not on file  Relationships  . Social connections:    Talks on phone: Not on file    Gets together: Not on file    Attends religious service: Not on file    Active member of club or organization: Not on file    Attends meetings of clubs or organizations: Not on file    Relationship status: Not on file  . Intimate partner violence:    Fear of current or ex partner: Not on file    Emotionally abused: Not on file    Physically abused: Not on file    Forced sexual activity: Not on file  Other Topics Concern  . Not on file  Social History Narrative   Lives alone.   [place]    Divorced 3 sons   Environmental consultant signed on    0 caffeine  Drinks daily       Brother died recently age 57s  No outpatient medications have been marked as taking for the 11/13/17 encounter [facility] Encounter).   Allergies  Allergen Reactions  . Azithromycin   . Propoxyphene N-Acetaminophen   . Simvastatin     REACTION: unspecified      ROS: As per HPI, remainder of ROS negative.   OBJECTIVE:   Vitals:   11/13/17 1345  BP: (!) 159/86  Pulse: 66  Resp: 16  Temp: 98.2 F (36.8 C)  TempSrc: Oral  SpO2: 100%     General appearance: alert; no distress Eyes: PERRL; EOMI; conjunctiva normal HENT: normocephalic; atraumatic; TMs normal, canal normal, external ears normal without trauma; nasal mucosa normal; oral mucosa normal Neck: supple Lungs: clear to auscultation bilaterally Heart: regular rate and rhythm Abdomen: soft, non-tender; bowel sounds normal; no masses or organomegaly; no guarding or rebound tenderness Back: no CVA tenderness Extremities: no cyanosis or edema; symmetrical with no gross deformities Skin: warm and dry Neurologic: normal gait; grossly normal Psychological: alert and cooperative; normal mood and affect      Labs:  Results  for orders placed or performed during the hospital encounter of 11/13/17  I-STAT, chem 8  Result Value Ref Range   Sodium 142 135 - 145 mmol/L   Potassium 4.2 3.5 - 5.1 mmol/L   Chloride 105 101 - 111 mmol/L   BUN 19 6 - 20 mg/dL   Creatinine, Ser 1.00 0.44 - 1.00 mg/dL   Glucose, Bld 97 65 - 99 mg/dL   Calcium, Ion 1.16 1.15 - 1.40 mmol/L   TCO2 27 22 - 32 mmol/L   Hemoglobin 11.9 (L) 12.0 - 15.0 g/dL   HCT 35.0 (L) 36.0 - 46.0 %    Labs Reviewed  POCT I-STAT, CHEM 8 - Abnormal; Notable for the following components:      Result Value   Hemoglobin 11.9 (*)    HCT 35.0 (*)    All other components within normal limits  TSH    No results found.     ASSESSMENT & PLAN:  1. Anemia, unspecified type    Please start taking over the counter iron (ferrous sulfate) one tablet daily and follow up with a personal health care provider.  If you don't have a primary care provider, you can call and make an appointment with Bernestine Amass. No orders of the defined types were placed in this encounter.   Reviewed expectations re: course of current medical issues. Questions answered. Outlined signs and symptoms indicating need for more acute intervention. Patient verbalized understanding. After Visit Summary given.    Procedures:      Robyn Haber, MD 11/13/17 1500

## 2017-11-13 NOTE — ED Triage Notes (Signed)
Bilateral finger tips intermittently numb and feeling tired for the past 2 days.  Patient's pcp has retired.

## 2017-11-14 ENCOUNTER — Telehealth (HOSPITAL_COMMUNITY): Payer: Self-pay

## 2017-11-14 NOTE — Telephone Encounter (Signed)
Results are within normal range. Pt contacted and made aware. Verbalized understanding.   

## 2018-01-28 ENCOUNTER — Ambulatory Visit: Payer: Medicare Other | Admitting: Podiatry

## 2018-02-19 ENCOUNTER — Other Ambulatory Visit: Payer: Self-pay

## 2018-02-19 ENCOUNTER — Ambulatory Visit (HOSPITAL_COMMUNITY)
Admission: EM | Admit: 2018-02-19 | Discharge: 2018-02-19 | Disposition: A | Discharge status: Home / Self Care | Payer: Medicare Other | Attending: Family Medicine | Admitting: Family Medicine

## 2018-02-19 ENCOUNTER — Encounter (HOSPITAL_COMMUNITY): Payer: Self-pay | Admitting: Emergency Medicine

## 2018-02-19 DIAGNOSIS — R531 Weakness: Secondary | ICD-10-CM | POA: Diagnosis not present

## 2018-02-19 DIAGNOSIS — R42 Dizziness and giddiness: Secondary | ICD-10-CM

## 2018-02-19 DIAGNOSIS — I10 Essential (primary) hypertension: Secondary | ICD-10-CM | POA: Diagnosis not present

## 2018-02-19 LAB — POCT URINALYSIS DIP (DEVICE)
BILIRUBIN URINE: NEGATIVE
Glucose, UA: NEGATIVE mg/dL
Hgb urine dipstick: NEGATIVE
Ketones, ur: NEGATIVE mg/dL
Nitrite: NEGATIVE
Protein, ur: NEGATIVE mg/dL
SPECIFIC GRAVITY, URINE: 1.025 (ref 1.005–1.030)
Urobilinogen, UA: 0.2 mg/dL (ref 0.0–1.0)
pH: 6.5 (ref 5.0–8.0)

## 2018-02-19 LAB — POCT I-STAT, CHEM 8
BUN: 15 mg/dL (ref 8–23)
Calcium, Ion: 1.21 mmol/L (ref 1.15–1.40)
Chloride: 105 mmol/L (ref 98–111)
Creatinine, Ser: 1 mg/dL (ref 0.44–1.00)
Glucose, Bld: 101 mg/dL — ABNORMAL HIGH (ref 70–99)
HCT: 36 % (ref 36.0–46.0)
Hemoglobin: 12.2 g/dL (ref 12.0–15.0)
POTASSIUM: 4.1 mmol/L (ref 3.5–5.1)
Sodium: 143 mmol/L (ref 135–145)
TCO2: 29 mmol/L (ref 22–32)

## 2018-02-19 NOTE — Discharge Instructions (Signed)
Your blood pressure was noted to be elevated during your visit today. You may return here within the next few days to recheck if unable to see your primary care doctor. ° °

## 2018-02-19 NOTE — ED Triage Notes (Addendum)
Pt states she feels weak all the time but she has been feeling worse lately and has been dizzy.  Pt has a medication list but she insists she doesn't take anything except baby aspirin and she does not use her CPAP.

## 2018-02-19 NOTE — ED Provider Notes (Signed)
[place]   [id number] 02/19/18 Arrival Time: 7867  ASSESSMENT & PLAN:  1. Weakness   2. Essential hypertension    She is looking for a new PCP as her doctor recently retired.  Follow-up Information    Schedule an appointment as soon as possible for a visit  with Pa, [place].   Specialty:  Family Medicine Contact information: [address] [place] [address] [phone]          No significant abnormalities on labs to explain her weakness. Notice increased BP. No tx desired. Prefers to find a new PCP to discuss. To ED if she feels symptoms are worsening.  Reviewed expectations re: course of current medical issues. Questions answered. Outlined signs and symptoms indicating need for more acute intervention. Patient verbalized understanding. After Visit Summary given.   SUBJECTIVE:  Cassandra English is a 84 y.o. female who reports intermittent episodes of "just feeling weak". This morning awoke and felt this way. Feels better now. Occasional and slight "lightheaded feeling." No dysequilibrium. No associated CP/SOB/n/v. No recent illnesses or medication changes. Slept well last evening. Has been in heat more; questions relation. Normal PO intake. No LE edema.   ROS: As per HPI.   OBJECTIVE:  Vitals:   02/19/18 1445  BP: (!) 184/97  Pulse: 62  Temp: 98.4 F (36.9 C)    General appearance: alert; no distress Eyes: PERRLA; EOMI; conjunctiva normal Neck: supple with FROM Lungs: clear to auscultation bilaterally Heart: regular rate and rhythm Abdomen: soft, non-tender; bowel sounds normal Extremities: no cyanosis or edema; symmetrical with no gross deformities Skin: warm and dry Neurologic: normal gait; normal symmetric reflexes; CN 2-12 grossly intact Psychological: alert and cooperative; normal mood and affect  Investigations: Results for orders placed or performed during the hospital encounter of  02/19/18  POCT urinalysis dip (device)  Result Value Ref Range   Glucose, UA NEGATIVE NEGATIVE mg/dL   Bilirubin Urine NEGATIVE NEGATIVE   Ketones, ur NEGATIVE NEGATIVE mg/dL   Specific Gravity, Urine 1.025 1.005 - 1.030   Hgb urine dipstick NEGATIVE NEGATIVE   pH 6.5 5.0 - 8.0   Protein, ur NEGATIVE NEGATIVE mg/dL   Urobilinogen, UA 0.2 0.0 - 1.0 mg/dL   Nitrite NEGATIVE NEGATIVE   Leukocytes, UA SMALL (A) NEGATIVE  I-STAT, chem 8  Result Value Ref Range   Sodium 143 135 - 145 mmol/L   Potassium 4.1 3.5 - 5.1 mmol/L   Chloride 105 98 - 111 mmol/L   BUN 15 8 - 23 mg/dL   Creatinine, Ser 1.00 0.44 - 1.00 mg/dL   Glucose, Bld 101 (H) 70 - 99 mg/dL   Calcium, Ion 1.21 1.15 - 1.40 mmol/L   TCO2 29 22 - 32 mmol/L   Hemoglobin 12.2 12.0 - 15.0 g/dL   HCT 36.0 36.0 - 46.0 %   Labs Reviewed  POCT URINALYSIS DIP (DEVICE) - Abnormal; Notable for the following components:      Result Value   Leukocytes, UA SMALL (*)    All other components within normal limits  POCT I-STAT, CHEM 8 - Abnormal; Notable for the following components:   Glucose, Bld 101 (*)    All other components within normal limits  URINE CULTURE   No results found.  Allergies  Allergen Reactions  . Azithromycin   . Propoxyphene N-Acetaminophen   . Simvastatin     REACTION: unspecified    Past Medical History:  Diagnosis Date  . ALLERGIC RHINITIS   .  Anemia    with borderline low wbc and neg heme eval 10 years ago.   Marland Kitchen Anxiety   . Asthma   . Callus   . Cataract   . Corns and callosities   . Diverticulitis     1 perf colon 10 99  . GERD (gastroesophageal reflux disease)    EGD 10 99 and 7 00  . HTN (hypertension)   . Hyperlipidemia   . Hypersomnia with sleep apnea    on CPAP  . Leukopenia    chronic. hx of hem consult years ago  . Multinodular goiter 2011   get yearly [place]  . Syncope 2010   echo ok. cards consult    . Wears glasses    Social History   Socioeconomic History  . Marital status:  Divorced    Spouse name: Not on file  . Number of children: 3  . Years of education: Not on file  . Highest education level: Not on file  Occupational History  . Occupation: Retired   Scientific laboratory technician  . Financial resource strain: Not on file  . Food insecurity:    Worry: Not on file    Inability: Not on file  . Transportation needs:    Medical: Not on file    Non-medical: Not on file  Tobacco Use  . Smoking status: Never Smoker  . Smokeless tobacco: Never Used  Substance and Sexual Activity  . Alcohol use: No  . Drug use: No  . Sexual activity: Not on file  Lifestyle  . Physical activity:    Days per week: Not on file    Minutes per session: Not on file  . Stress: Not on file  Relationships  . Social connections:    Talks on phone: Not on file    Gets together: Not on file    Attends religious service: Not on file    Active member of club or organization: Not on file    Attends meetings of clubs or organizations: Not on file    Relationship status: Not on file  . Intimate partner violence:    Fear of current or ex partner: Not on file    Emotionally abused: Not on file    Physically abused: Not on file    Forced sexual activity: Not on file  Other Topics Concern  . Not on file  Social History Narrative   Lives alone.   [facility]    Divorced 3 sons   Environmental consultant signed on    0 caffeine  Drinks daily       Brother died recently age 74s   Family History  Problem Relation Age of Onset  . Coronary artery disease Mother   . Coronary artery disease Father   . Stroke Sister   . Colon cancer Neg Hx    Past Surgical History:  Procedure Laterality Date  . CARPAL TUNNEL RELEASE Right 06/29/2013   Procedure: RIGHT CARPAL TUNNEL RELEASE;  Surgeon: Tennis Must, MD;  Location: [facility];  Service: Orthopedics;  Laterality: Right;  . CATARACT EXTRACTION    . FOOT SURGERY Right 1990's    Hammer toe surgery  . TOTAL ABDOMINAL  HYSTERECTOMY        Vanessa Kick, MD 03/01/18 [phone]

## 2018-02-20 LAB — URINE CULTURE

## 2018-07-12 ENCOUNTER — Emergency Department (HOSPITAL_COMMUNITY): Payer: Medicare Other

## 2018-07-12 ENCOUNTER — Encounter (HOSPITAL_COMMUNITY): Payer: Self-pay | Admitting: Emergency Medicine

## 2018-07-12 ENCOUNTER — Emergency Department (HOSPITAL_COMMUNITY)
Admission: EM | Admit: 2018-07-12 | Discharge: 2018-07-12 | Disposition: A | Discharge status: Home / Self Care | Payer: Medicare Other | Attending: Emergency Medicine | Admitting: Emergency Medicine

## 2018-07-12 DIAGNOSIS — R1110 Vomiting, unspecified: Secondary | ICD-10-CM | POA: Diagnosis not present

## 2018-07-12 DIAGNOSIS — R1030 Lower abdominal pain, unspecified: Secondary | ICD-10-CM | POA: Diagnosis not present

## 2018-07-12 DIAGNOSIS — K5792 Diverticulitis of intestine, part unspecified, without perforation or abscess without bleeding: Secondary | ICD-10-CM | POA: Diagnosis not present

## 2018-07-12 DIAGNOSIS — R339 Retention of urine, unspecified: Secondary | ICD-10-CM | POA: Diagnosis not present

## 2018-07-12 DIAGNOSIS — K219 Gastro-esophageal reflux disease without esophagitis: Secondary | ICD-10-CM | POA: Diagnosis not present

## 2018-07-12 DIAGNOSIS — Z7982 Long term (current) use of aspirin: Secondary | ICD-10-CM | POA: Diagnosis not present

## 2018-07-12 DIAGNOSIS — R197 Diarrhea, unspecified: Secondary | ICD-10-CM | POA: Insufficient documentation

## 2018-07-12 DIAGNOSIS — N281 Cyst of kidney, acquired: Secondary | ICD-10-CM | POA: Diagnosis not present

## 2018-07-12 DIAGNOSIS — I10 Essential (primary) hypertension: Secondary | ICD-10-CM | POA: Diagnosis not present

## 2018-07-12 DIAGNOSIS — R531 Weakness: Secondary | ICD-10-CM | POA: Diagnosis not present

## 2018-07-12 LAB — COMPREHENSIVE METABOLIC PANEL
ALT: 44 U/L (ref 0–44)
AST: 51 U/L — ABNORMAL HIGH (ref 15–41)
Albumin: 3.6 g/dL (ref 3.5–5.0)
Alkaline Phosphatase: 69 U/L (ref 38–126)
Anion gap: 9 (ref 5–15)
BUN: 15 mg/dL (ref 8–23)
CO2: 27 mmol/L (ref 22–32)
Calcium: 9 mg/dL (ref 8.9–10.3)
Chloride: 105 mmol/L (ref 98–111)
Creatinine, Ser: 0.97 mg/dL (ref 0.44–1.00)
GFR, EST NON AFRICAN AMERICAN: 52 mL/min — AB (ref >60)
GLUCOSE: 107 mg/dL — AB (ref 70–99)
POTASSIUM: 3.1 mmol/L — AB (ref 3.5–5.1)
SODIUM: 141 mmol/L (ref 135–145)
TOTAL PROTEIN: 6.8 g/dL (ref 6.5–8.1)
Total Bilirubin: 0.7 mg/dL (ref 0.3–1.2)

## 2018-07-12 LAB — URINALYSIS, ROUTINE W REFLEX MICROSCOPIC
Bilirubin Urine: NEGATIVE
GLUCOSE, UA: NEGATIVE mg/dL
Hgb urine dipstick: NEGATIVE
KETONES UR: NEGATIVE mg/dL
LEUKOCYTES UA: NEGATIVE
Nitrite: NEGATIVE
PH: 6 (ref 5.0–8.0)
Protein, ur: NEGATIVE mg/dL
Specific Gravity, Urine: 1.013 (ref 1.005–1.030)

## 2018-07-12 LAB — C DIFFICILE QUICK SCREEN W PCR REFLEX
C DIFFICILE (CDIFF) INTERP: NOT DETECTED
C DIFFICILE (CDIFF) TOXIN: NEGATIVE
C DIFFICLE (CDIFF) ANTIGEN: NEGATIVE

## 2018-07-12 LAB — CBC
HCT: 34.4 % — ABNORMAL LOW (ref 36.0–46.0)
Hemoglobin: 10.5 g/dL — ABNORMAL LOW (ref 12.0–15.0)
MCH: 26.6 pg (ref 26.0–34.0)
MCHC: 30.5 g/dL (ref 30.0–36.0)
MCV: 87.3 fL (ref 80.0–100.0)
PLATELETS: 203 10*3/uL (ref 150–400)
RBC: 3.94 MIL/uL (ref 3.87–5.11)
RDW: 14.9 % (ref 11.5–15.5)
WBC: 5.4 10*3/uL (ref 4.0–10.5)
nRBC: 0 % (ref 0.0–0.2)

## 2018-07-12 LAB — I-STAT TROPONIN, ED: Troponin i, poc: 0.01 ng/mL (ref 0.00–0.08)

## 2018-07-12 LAB — POC OCCULT BLOOD, ED: FECAL OCCULT BLD: NEGATIVE

## 2018-07-12 LAB — LIPASE, BLOOD: Lipase: 24 U/L (ref 11–51)

## 2018-07-12 MED ORDER — IOHEXOL 300 MG/ML  SOLN
100.0000 mL | Freq: Once | INTRAMUSCULAR | Status: AC | PRN
  Administered 2018-07-12: 100 mL via INTRAVENOUS

## 2018-07-12 MED ORDER — ONDANSETRON HCL 4 MG/2ML IJ SOLN
4.0000 mg | Freq: Once | INTRAMUSCULAR | Status: AC
Start: 2018-07-12 — End: 2018-07-12
  Administered 2018-07-12: 4 mg via INTRAVENOUS
  Filled 2018-07-12: qty 2

## 2018-07-12 MED ORDER — SODIUM CHLORIDE 0.9 % IV BOLUS
1000.0000 mL | Freq: Once | INTRAVENOUS | Status: AC
  Administered 2018-07-12: 1000 mL via INTRAVENOUS

## 2018-07-12 NOTE — Discharge Instructions (Addendum)
Follow-up with urology in 2 to 3 days for continued evaluation.  Return to the ED immediately for new or worsening symptoms, such as vomiting, abdominal pain, fevers or any concerns at all.

## 2018-07-12 NOTE — ED Notes (Signed)
Patient transported to CT 

## 2018-07-12 NOTE — ED Notes (Signed)
Partner for in/box : [phone]

## 2018-07-12 NOTE — ED Triage Notes (Signed)
Per [facility]: Patient to ED from home (independent living) c/o diarrhea x 2 days and generalized weakness x 2-3 weeks. Denies fevers/chills.

## 2018-07-12 NOTE — ED Provider Notes (Signed)
[facility] DEPART[facility] Provider Note   CSN: [id number] Arrival date & time: 07/12/18  0809     History   Chief Complaint Chief Complaint  Patient presents with  . Diarrhea    HPI Cassandra English is a 84 y.o. female.   Diarrhea   Pertinent negatives include no abdominal pain, no vomiting, no chills and no cough.   84 year old female, denies significant past medical history, presents with diarrhea for the last 2 to 3 days.  Patient states approximately 2-3 episodes of diarrhea in the last hours.  She describes diarrhea as watery and states she is having a hard time making it to the bathroom.  She denies any hematochezia but does state her stool has been very dark in color.  Family notes generalized weakness for the last 2 to 3 weeks.  She denies any fevers, abdominal pain, nausea, vomiting, dysuria, urinary urgency.  Patient denies any chest pain, shortness of breath.  Denies any recent antibiotic use, history of C. difficile.    Past Medical History:  Diagnosis Date  . ALLERGIC RHINITIS   . Anemia    with borderline low wbc and neg heme eval 10 years ago.   Marland Kitchen Anxiety   . Asthma   . Callus   . Cataract   . Corns and callosities   . Diverticulitis     1 perf colon 10 99  . GERD (gastroesophageal reflux disease)    EGD 10 99 and 7 00  . HTN (hypertension)   . Hyperlipidemia   . Hypersomnia with sleep apnea    on CPAP  . Leukopenia    chronic. hx of hem consult years ago  . Multinodular goiter 2011   get yearly [place]  . Syncope 2010   echo ok. cards consult    . Wears glasses     Patient Active Problem List   Diagnosis Date Noted  . Pain, foot 06/17/2013  . Callus of foot 11/10/2012  . Onychomycosis due to dermatophyte 11/10/2012  . Fatigue 04/05/2012  . Abnormal thyroid blood test 04/03/2012  . Multinodular goiter 02/20/2012  . Medicare annual wellness visit, subsequent 01/05/2012  . GERD 05/25/2010  . FATIGUE 02/23/2010  .  GOITER, MULTINODULAR 02/09/2010  . LEUKOPENIA, CHRONIC 03/24/2009  . OSA (obstructive sleep apnea) 07/10/2007  . HYPERLIPIDEMIA 03/04/2007  . Essential hypertension 03/04/2007    Past Surgical History:  Procedure Laterality Date  . CARPAL TUNNEL RELEASE Right 06/29/2013   Procedure: RIGHT CARPAL TUNNEL RELEASE;  Surgeon: Tennis Must, MD;  Location: [facility];  Service: Orthopedics;  Laterality: Right;  . CATARACT EXTRACTION    . FOOT SURGERY Right 1990's    Hammer toe surgery  . TOTAL ABDOMINAL HYSTERECTOMY       OB History   No obstetric history on file.      Home Medications    Prior to Admission medications   Medication Sig Start Date End Date Taking? Authorizing Provider  aspirin 81 MG tablet Take 81 mg by mouth daily.     Yes [provider]  NON FORMULARY CPAP machine.  Use as directed at bedtime.   Yes [provider]  losartan (COZAAR) 50 MG tablet TAKE ONE-HALF TABLET BY MOUTH EVERY DAY Patient not taking: Reported on 07/12/2018 05/27/12   Panosh, Standley Brooking, MD    Family History Family History  Problem Relation Age of Onset  . Coronary artery disease Mother   . Coronary artery disease Father   .  Stroke Sister   . Colon cancer Neg Hx     Social History Social History   Tobacco Use  . Smoking status: Never Smoker  . Smokeless tobacco: Never Used  Substance Use Topics  . Alcohol use: No  . Drug use: No     Allergies   Azithromycin; Propoxyphene n-acetaminophen; and Simvastatin   Review of Systems Review of Systems  Constitutional: Negative for chills and fever.  HENT: Negative for rhinorrhea and sore throat.   Eyes: Negative for visual disturbance.  Respiratory: Negative for cough and shortness of breath.   Cardiovascular: Negative for chest pain and leg swelling.  Gastrointestinal: Positive for diarrhea. Negative for abdominal pain, nausea and vomiting.  Genitourinary: Negative for dysuria, frequency and  urgency.  Musculoskeletal: Negative for joint swelling and neck pain.  Skin: Negative for rash and wound.  Neurological: Positive for weakness (generalized). Negative for syncope and numbness.  All other systems reviewed and are negative.    Physical Exam Updated Vital Signs BP 106/88   Pulse 75   Temp 99.1 F (37.3 C) (Rectal)   Resp 14   SpO2 100%   Physical Exam Vitals signs and nursing note reviewed.  Constitutional:      Appearance: She is well-developed.  HENT:     Head: Normocephalic and atraumatic.  Eyes:     Conjunctiva/sclera: Conjunctivae normal.  Neck:     Musculoskeletal: Neck supple.  Cardiovascular:     Rate and Rhythm: Normal rate and regular rhythm.     Heart sounds: Normal heart sounds. No murmur.  Pulmonary:     Effort: Pulmonary effort is normal. No respiratory distress.     Breath sounds: Normal breath sounds. No wheezing or rales.  Abdominal:     General: Bowel sounds are normal. There is no distension.     Palpations: Abdomen is soft.     Tenderness: There is no abdominal tenderness.  Genitourinary:    Rectum: Normal.     Comments: Stool brown in color, no obvious bleeding, no hemorrhoids, no melena Musculoskeletal: Normal range of motion.        General: No tenderness or deformity.  Skin:    General: Skin is warm and dry.     Findings: No erythema or rash.  Neurological:     Mental Status: She is alert and oriented to person, place, and time.  Psychiatric:        Behavior: Behavior normal.      ED Treatments / Results  Labs (all labs ordered are listed, but only abnormal results are displayed) Labs Reviewed  CBC - Abnormal; Notable for the following components:      Result Value   Hemoglobin 10.5 (*)    HCT 34.4 (*)    All other components within normal limits  COMPREHENSIVE METABOLIC PANEL - Abnormal; Notable for the following components:   Potassium 3.1 (*)    Glucose, Bld 107 (*)    AST 51 (*)    GFR calc non Af Amer 52 (*)     All other components within normal limits  C DIFFICILE QUICK SCREEN W PCR REFLEX  GASTROINTESTINAL PANEL BY PCR, STOOL (REPLACES STOOL CULTURE)  LIPASE, BLOOD  URINALYSIS, ROUTINE W REFLEX MICROSCOPIC  I-STAT TROPONIN, ED  POC OCCULT BLOOD, ED    EKG EKG Interpretation  Date/Time:  Saturday July 12 2018 08:26:34 EST Ventricular Rate:  78 PR Interval:    QRS Duration: 63 QT Interval:  496 QTC Calculation: 566 R Axis:  34 Text Interpretation:  Sinus rhythm Borderline low voltage, extremity leads Nonspecific T abnormalities, lateral leads No significant change since last tracing Confirmed by Wandra Arthurs [phone]) on 07/12/2018 10:32:07 AM   Radiology Ct Abdomen Pelvis W Contrast  Result Date: 07/12/2018 CLINICAL DATA:  Acute generalized abdominal pain, weakness for 2-3 weeks, diarrhea for 2 days; history asthma, diverticulitis, GERD, hypertension EXAM: CT ABDOMEN AND PELVIS WITH CONTRAST TECHNIQUE: Multidetector CT imaging of the abdomen and pelvis was performed using the standard protocol following bolus administration of intravenous contrast. Sagittal and coronal MPR images reconstructed from axial data set. CONTRAST:  14mL OMNIPAQUE IOHEXOL 300 MG/ML SOLN IV. No oral contrast. COMPARISON:  None FINDINGS: Lower chest: Mild bibasilar dependent atelectasis Hepatobiliary: Gallbladder and liver normal appearance Pancreas: Atrophic, otherwise unremarkable Spleen: Normal appearance Adrenals/Urinary Tract: Adrenal glands normal appearance. Multiple BILATERAL renal cysts. Moderate LEFT renal collecting system dilatation and dilatation of proximal LEFT ureter. Marked bladder distension, reaching above umbilicus, measuring 68.3 x 10.6 x 13.9 cm (volume = 1430 cm^3). Stomach/Bowel: Significantly increased stool within rectum. Mild distal rectal wall thickening question proctitis/stercoral colitis. Appendix not identified. And tiny hiatal hernia. Stomach and bowel loops otherwise normal  appearance. Vascular/Lymphatic: Numerous pelvic phleboliths. Scattered atherosclerotic calcifications aorta and iliac arteries without aneurysm. Few scattered coronary arterial calcifications. No adenopathy. Reproductive: Atrophic uterus.  Nonvisualization of ovaries. Other: No free air or free fluid. No hernia or acute inflammatory process otherwise seen. Musculoskeletal: Diffuse osseous demineralization. IMPRESSION: Increased rectal stool with distal rectal wall thickening question proctitis versus stercoral colitis. Marked bladder distension with associated dilatation of LEFT renal collecting system and LEFT proximal ureter. BILATERAL renal cysts. Electronically Signed   By: Lavonia Dana M.D.   On: 07/12/2018 13:02    Procedures Procedures (including critical care time)  Medications Ordered in ED Medications  sodium chloride 0.9 % bolus 1,000 mL (0 mLs Intravenous Stopped 07/12/18 1000)  ondansetron (ZOFRAN) injection 4 mg (4 mg Intravenous Given 07/12/18 1110)  iohexol (OMNIPAQUE) 300 MG/ML solution 100 mL (100 mLs Intravenous Contrast Given 07/12/18 1225)     Initial Impression / Assessment and Plan / ED Course  I have reviewed the triage vital signs and the nursing notes.  Pertinent labs & imaging results that were available during my care of the patient were reviewed by me and considered in my medical decision making (see chart for details).  Clinical Course as of Jul 13 1439  Sat Jul 12, 2018  1120 Provider informed the patient was vomiting.  When reexamined patient.  She states she has vomiting and continues to have diarrhea.  She is now complaining of lower abdominal pain.  Abdomen reexamined and tender palpation over the right and left lower quadrants and suprapubic region.  CT abdomen pelvis added.   [CK]    Clinical Course User Index [CK] Teng Decou S, PA-C    Resting comfortably in bed, no acute distress, nontoxic, non-lethargic.  Vital signs stable.  Abdomen soft and  nontender palpation.  Patient p.o. tolerant to ginger ale.  She had a Foley placed in approximately 1900 cc of urine pulled off gradually.  Believe her urinary retention is the cause of her stool retention and colitis.  Discussed discharge home with patient and family.  Patient would like to go home and follow-up outpatient.  Will discharge with Foley and give information for close follow-up with urology.  Patient and family expressed understanding.  Strict return precautions.   At this time there does not appear to be  any evidence of an acute emergency medical condition and the patient appears stable for discharge with appropriate outpatient follow up.Diagnosis was discussed with patient who verbalizes understanding and is agreeable to discharge. Pt case discussed with Dr. Darl Householder who agrees with my plan.   Final Clinical Impressions(s) / ED Diagnoses   Final diagnoses:  Urinary retention  Vomiting and diarrhea    ED Discharge Orders    None       Rachel Moulds 07/13/18 1154    Drenda Freeze, MD 07/15/18 1149

## 2018-07-13 LAB — GASTROINTESTINAL PANEL BY PCR, STOOL (REPLACES STOOL CULTURE)

## 2018-07-19 ENCOUNTER — Encounter (HOSPITAL_COMMUNITY): Payer: Self-pay

## 2018-07-19 ENCOUNTER — Other Ambulatory Visit: Payer: Self-pay

## 2018-07-19 ENCOUNTER — Emergency Department (HOSPITAL_COMMUNITY)
Admission: EM | Admit: 2018-07-19 | Discharge: 2018-07-19 | Disposition: A | Discharge status: Home / Self Care | Payer: Medicare Other | Attending: Emergency Medicine | Admitting: Emergency Medicine

## 2018-07-19 DIAGNOSIS — Z7982 Long term (current) use of aspirin: Secondary | ICD-10-CM | POA: Insufficient documentation

## 2018-07-19 DIAGNOSIS — E785 Hyperlipidemia, unspecified: Secondary | ICD-10-CM | POA: Diagnosis not present

## 2018-07-19 DIAGNOSIS — Z79899 Other long term (current) drug therapy: Secondary | ICD-10-CM | POA: Insufficient documentation

## 2018-07-19 DIAGNOSIS — K5900 Constipation, unspecified: Secondary | ICD-10-CM | POA: Diagnosis not present

## 2018-07-19 DIAGNOSIS — J45909 Unspecified asthma, uncomplicated: Secondary | ICD-10-CM | POA: Insufficient documentation

## 2018-07-19 DIAGNOSIS — I10 Essential (primary) hypertension: Secondary | ICD-10-CM | POA: Diagnosis not present

## 2018-07-19 MED ORDER — POLYETHYLENE GLYCOL 3350 17 G PO PACK: 17.0000 g | PACK | Freq: Every day | ORAL | 0 refills | Status: AC

## 2018-07-19 MED ORDER — SENNOSIDES-DOCUSATE SODIUM 8.6-50 MG PO TABS: 1.0000 | ORAL_TABLET | Freq: Two times a day (BID) | ORAL | 0 refills | Status: DC | PRN

## 2018-07-19 NOTE — ED Provider Notes (Signed)
[facility] Provider Note   CSN: [id number] Arrival date & time: 07/19/18  0753     History   Chief Complaint Chief Complaint  Patient presents with  . Constipation    HPI Cassandra English is a 84 y.o. female.  HPI   84 year old female with history below presents with concern for constipation.  Patient reports she is not had a bowel movement in 1 week.  Denies abdominal pain, nausea, vomiting.  Reports she is been eating and drinking okay.  She had recently been seen for urinary retention, however reports that she has been urinating normally at this time, without other concerns.  Denies any fevers, chills, other concerns.  Reports she has not tried anything yet for her, her constipation.  Reports no history of prior constipation.  Denies medication changes other than taking Azo which she is no longer on.   Past Medical History:  Diagnosis Date  . ALLERGIC RHINITIS   . Anemia    with borderline low wbc and neg heme eval 10 years ago.   Marland Kitchen Anxiety   . Asthma   . Callus   . Cataract   . Corns and callosities   . Diverticulitis     1 perf colon 10 99  . GERD (gastroesophageal reflux disease)    EGD 10 99 and 7 00  . HTN (hypertension)   . Hyperlipidemia   . Hypersomnia with sleep apnea    on CPAP  . Leukopenia    chronic. hx of hem consult years ago  . Multinodular goiter 2011   get yearly [place]  . Syncope 2010   echo ok. cards consult    . Wears glasses     Patient Active Problem List   Diagnosis Date Noted  . Pain, foot 06/17/2013  . Callus of foot 11/10/2012  . Onychomycosis due to dermatophyte 11/10/2012  . Fatigue 04/05/2012  . Abnormal thyroid blood test 04/03/2012  . Multinodular goiter 02/20/2012  . Medicare annual wellness visit, subsequent 01/05/2012  . GERD 05/25/2010  . FATIGUE 02/23/2010  . GOITER, MULTINODULAR 02/09/2010  . LEUKOPENIA, CHRONIC 03/24/2009  . OSA (obstructive sleep apnea) 07/10/2007  .  HYPERLIPIDEMIA 03/04/2007  . Essential hypertension 03/04/2007    Past Surgical History:  Procedure Laterality Date  . CARPAL TUNNEL RELEASE Right 06/29/2013   Procedure: RIGHT CARPAL TUNNEL RELEASE;  Surgeon: Tennis Must, MD;  Location: [facility];  Service: Orthopedics;  Laterality: Right;  . CATARACT EXTRACTION    . FOOT SURGERY Right 1990's    Hammer toe surgery  . TOTAL ABDOMINAL HYSTERECTOMY       OB History   No obstetric history on file.      Home Medications    Prior to Admission medications   Medication Sig Start Date End Date Taking? Authorizing Provider  aspirin 81 MG tablet Take 81 mg by mouth daily.      [provider]  losartan (COZAAR) 50 MG tablet TAKE ONE-HALF TABLET BY MOUTH EVERY DAY Patient not taking: Reported on 07/12/2018 05/27/12   Panosh, Standley Brooking, MD  NON FORMULARY CPAP machine.  Use as directed at bedtime.    [provider]  polyethylene glycol (MIRALAX) packet Take 17 g by mouth daily. 07/19/18   Gareth Morgan, MD  senna-docusate (SENOKOT-S) 8.6-50 MG tablet Take 1 tablet by mouth 2 (two) times daily as needed for mild constipation. 07/19/18   Gareth Morgan, MD    Family History Family History  Problem Relation Age of Onset  . Coronary artery disease Mother   . Coronary artery disease Father   . Stroke Sister   . Colon cancer Neg Hx     Social History Social History   Tobacco Use  . Smoking status: Never Smoker  . Smokeless tobacco: Never Used  Substance Use Topics  . Alcohol use: No  . Drug use: No     Allergies   Azithromycin; Propoxyphene n-acetaminophen; and Simvastatin   Review of Systems Review of Systems  Constitutional: Negative for fever.  HENT: Negative for sore throat.   Eyes: Negative for visual disturbance.  Respiratory: Negative for cough and shortness of breath.   Cardiovascular: Negative for chest pain.  Gastrointestinal: Positive for constipation. Negative for  abdominal pain, nausea and vomiting.  Genitourinary: Negative for difficulty urinating and dysuria.  Musculoskeletal: Negative for back pain and neck pain.  Skin: Negative for rash.  Neurological: Negative for syncope and headaches.     Physical Exam Updated Vital Signs BP (!) 180/87 (BP Location: Right Arm)   Pulse 73   Temp 97.6 F (36.4 C) (Oral)   Resp 16   SpO2 99%   Physical Exam Vitals signs and nursing note reviewed.  Constitutional:      General: She is not in acute distress.    Appearance: She is well-developed. She is not diaphoretic.  HENT:     Head: Normocephalic and atraumatic.  Eyes:     Conjunctiva/sclera: Conjunctivae normal.  Neck:     Musculoskeletal: Normal range of motion.  Cardiovascular:     Rate and Rhythm: Normal rate and regular rhythm.     Heart sounds: Normal heart sounds. No murmur. No friction rub. No gallop.   Pulmonary:     Effort: Pulmonary effort is normal. No respiratory distress.     Breath sounds: Normal breath sounds. No wheezing or rales.  Abdominal:     General: There is no distension.     Palpations: Abdomen is soft.     Tenderness: There is no abdominal tenderness. There is no guarding.  Musculoskeletal:        General: No tenderness.  Skin:    General: Skin is warm and dry.     Findings: No erythema or rash.  Neurological:     Mental Status: She is alert and oriented to person, place, and time.      ED Treatments / Results  Labs (all labs ordered are listed, but only abnormal results are displayed) Labs Reviewed - No data to display  EKG None  Radiology No results found.  Procedures Procedures (including critical care time)  Medications Ordered in ED Medications - No data to display   Initial Impression / Assessment and Plan / ED Course  I have reviewed the triage vital signs and the nursing notes.  Pertinent labs & imaging results that were available during my care of the patient were reviewed by me and  considered in my medical decision making (see chart for details).      84 year old female with history below presents with concern for constipation.  Patient without abdominal pain, no distention, no tenderness, is tolerating po without nausea or vomiting and doubt obstruction, diverticulitis, or other acute intraabdominal pathology.  Discussed importance of hydration, diet and recommended senokot, and miralax taper.  Patient discharged in stable condition with understanding of reasons to return.    Final Clinical Impressions(s) / ED Diagnoses   Final diagnoses:  Constipation, unspecified constipation type  ED Discharge Orders         Ordered    senna-docusate (SENOKOT-S) 8.6-50 MG tablet  2 times daily PRN     07/19/18 0813    polyethylene glycol (MIRALAX) packet  Daily     07/19/18 8177           Gareth Morgan, MD 07/19/18 1327

## 2018-07-19 NOTE — Discharge Instructions (Signed)
You have presented with constipation.  You do not have abdominal pain, nausea or vomiting, are able to eat and I doubt emergent obstruction or other surgical problem in your abdomen.  Recommend hydration, activity as tolerated (walking), high fiber diet and initiating senokot and miralax.  If you do not have relief with one cap per day of miralax in 2 days, I recommended a miralax taper (4 caps in 1 32oz bottle day 1, 3 caps on day 2 if no results, 2 caps on day 3 etc.

## 2018-07-19 NOTE — ED Triage Notes (Signed)
Pt c/o constipation for 1 week. Pt denies ABD pain or n/v.

## 2018-07-19 NOTE — ED Notes (Signed)
Got patient undress on the monitor got patient a warm blanket patient is resting with call bell in reach

## 2018-07-29 DIAGNOSIS — R3914 Feeling of incomplete bladder emptying: Secondary | ICD-10-CM | POA: Diagnosis not present

## 2018-07-29 DIAGNOSIS — N281 Cyst of kidney, acquired: Secondary | ICD-10-CM | POA: Diagnosis not present

## 2018-07-29 DIAGNOSIS — R338 Other retention of urine: Secondary | ICD-10-CM | POA: Diagnosis not present

## 2018-10-02 ENCOUNTER — Ambulatory Visit: Payer: Medicare Other | Admitting: Podiatry

## 2019-02-23 ENCOUNTER — Encounter (HOSPITAL_COMMUNITY): Payer: Self-pay

## 2019-02-23 ENCOUNTER — Other Ambulatory Visit: Payer: Self-pay

## 2019-02-23 ENCOUNTER — Ambulatory Visit (HOSPITAL_COMMUNITY)
Admission: EM | Admit: 2019-02-23 | Discharge: 2019-02-23 | Disposition: A | Discharge status: Home / Self Care | Payer: Medicare Other | Attending: Family Medicine | Admitting: Family Medicine

## 2019-02-23 DIAGNOSIS — R410 Disorientation, unspecified: Secondary | ICD-10-CM

## 2019-02-23 DIAGNOSIS — R630 Anorexia: Secondary | ICD-10-CM | POA: Diagnosis not present

## 2019-02-23 DIAGNOSIS — R531 Weakness: Secondary | ICD-10-CM | POA: Diagnosis not present

## 2019-02-23 LAB — BASIC METABOLIC PANEL
Anion gap: 9 (ref 5–15)
BUN: 19 mg/dL (ref 8–23)
CO2: 27 mmol/L (ref 22–32)
Calcium: 9.3 mg/dL (ref 8.9–10.3)
Chloride: 106 mmol/L (ref 98–111)
Creatinine, Ser: 1.12 mg/dL — ABNORMAL HIGH (ref 0.44–1.00)
GFR calc Af Amer: 50 mL/min — ABNORMAL LOW (ref >60)
GFR calc non Af Amer: 43 mL/min — ABNORMAL LOW (ref >60)
Glucose, Bld: 90 mg/dL (ref 70–99)
Potassium: 3.9 mmol/L (ref 3.5–5.1)
Sodium: 142 mmol/L (ref 135–145)

## 2019-02-23 LAB — CBC
HCT: 33.4 % — ABNORMAL LOW (ref 36.0–46.0)
Hemoglobin: 10.4 g/dL — ABNORMAL LOW (ref 12.0–15.0)
MCH: 27.6 pg (ref 26.0–34.0)
MCHC: 31.1 g/dL (ref 30.0–36.0)
MCV: 88.6 fL (ref 80.0–100.0)
Platelets: 166 10*3/uL (ref 150–400)
RBC: 3.77 MIL/uL — ABNORMAL LOW (ref 3.87–5.11)
RDW: 14.5 % (ref 11.5–15.5)
WBC: 2.4 10*3/uL — ABNORMAL LOW (ref 4.0–10.5)
nRBC: 0 % (ref 0.0–0.2)

## 2019-02-23 LAB — POCT URINALYSIS DIP (DEVICE)
Bilirubin Urine: NEGATIVE
Glucose, UA: NEGATIVE mg/dL
Ketones, ur: NEGATIVE mg/dL
Nitrite: NEGATIVE
Protein, ur: NEGATIVE mg/dL
Specific Gravity, Urine: 1.025 (ref 1.005–1.030)
Urobilinogen, UA: 0.2 mg/dL (ref 0.0–1.0)
pH: 6.5 (ref 5.0–8.0)

## 2019-02-23 NOTE — ED Triage Notes (Addendum)
Per pt son, pt present fatigue and loss of appetite. Symptoms started two weeks ago.  Pt has been taking in fluids.

## 2019-02-23 NOTE — Discharge Instructions (Addendum)
Sending urine sample for culture EKG did not show anything concerning. Most likely the increased fatigue and loss of appetite is from dementia and part of the aging process.  Make sure she is getting the proper nutrition through boost drinks or protein shakes.  The blood work was not concerning.  She needs a primary care follow up and possible palliative care follow up.  No need to go to the hospital.

## 2019-02-23 NOTE — ED Provider Notes (Addendum)
[place]    CSN: [id number] Arrival date & time: 02/23/19  1339     History   Chief Complaint Chief Complaint  Patient presents with  . Fatigue  . Anorexia    HPI Cassandra English is a 85 y.o. female.   Patient is a 85 year old female who presents today with her son.  Son is concerned that she has been having more fatigue and loss of appetite.  This is been worsening over the last few weeks.  She has been living with him for approximately 7 months.  He is reporting significant amount of weight loss.  He reports that she spends most of her time sleeping and has some confusion at times.  She has not complained of any pain.  She has not had any nausea, vomiting or diarrhea.  She has been voiding normally.  No cough, chest pain or shortness of breath. Denies any known hx of dementia.   ROS per HPI      Past Medical History:  Diagnosis Date  . ALLERGIC RHINITIS   . Anemia    with borderline low wbc and neg heme eval 10 years ago.   Marland Kitchen Anxiety   . Asthma   . Callus   . Cataract   . Corns and callosities   . Diverticulitis     1 perf colon 10 99  . GERD (gastroesophageal reflux disease)    EGD 10 99 and 7 00  . HTN (hypertension)   . Hyperlipidemia   . Hypersomnia with sleep apnea    on CPAP  . Leukopenia    chronic. hx of hem consult years ago  . Multinodular goiter 2011   get yearly [place]  . Syncope 2010   echo ok. cards consult    . Wears glasses     Patient Active Problem List   Diagnosis Date Noted  . Pain, foot 06/17/2013  . Callus of foot 11/10/2012  . Onychomycosis due to dermatophyte 11/10/2012  . Fatigue 04/05/2012  . Abnormal thyroid blood test 04/03/2012  . Multinodular goiter 02/20/2012  . Medicare annual wellness visit, subsequent 01/05/2012  . GERD 05/25/2010  . FATIGUE 02/23/2010  . GOITER, MULTINODULAR 02/09/2010  . LEUKOPENIA, CHRONIC 03/24/2009  . OSA (obstructive sleep apnea) 07/10/2007  . HYPERLIPIDEMIA 03/04/2007  .  Essential hypertension 03/04/2007    Past Surgical History:  Procedure Laterality Date  . CARPAL TUNNEL RELEASE Right 06/29/2013   Procedure: RIGHT CARPAL TUNNEL RELEASE;  Surgeon: Tennis Must, MD;  Location: [facility];  Service: Orthopedics;  Laterality: Right;  . CATARACT EXTRACTION    . FOOT SURGERY Right 1990's    Hammer toe surgery  . TOTAL ABDOMINAL HYSTERECTOMY      OB History   No obstetric history on file.      Home Medications    Prior to Admission medications   Medication Sig Start Date End Date Taking? Authorizing Provider  aspirin 81 MG tablet Take 81 mg by mouth daily.      [provider]  losartan (COZAAR) 50 MG tablet TAKE ONE-HALF TABLET BY MOUTH EVERY DAY Patient not taking: Reported on 07/12/2018 05/27/12   Panosh, Standley Brooking, MD  NON FORMULARY CPAP machine.  Use as directed at bedtime.    [provider]  polyethylene glycol (MIRALAX) packet Take 17 g by mouth daily. 07/19/18   Gareth Morgan, MD  senna-docusate (SENOKOT-S) 8.6-50 MG tablet Take 1 tablet by mouth 2 (two) times daily as needed for  mild constipation. 07/19/18   Gareth Morgan, MD    Family History Family History  Problem Relation Age of Onset  . Coronary artery disease Mother   . Coronary artery disease Father   . Stroke Sister   . Colon cancer Neg Hx     Social History Social History   Tobacco Use  . Smoking status: Never Smoker  . Smokeless tobacco: Never Used  Substance Use Topics  . Alcohol use: No  . Drug use: No     Allergies   Azithromycin, Propoxyphene n-acetaminophen, and Simvastatin   Review of Systems Review of Systems  Constitutional: Positive for activity change, appetite change and fatigue.  HENT: Negative for congestion.   Eyes: Negative for visual disturbance.  Respiratory: Negative for cough, chest tightness and shortness of breath.   Cardiovascular: Negative for chest pain, palpitations and leg swelling.   Gastrointestinal: Negative for abdominal distention, abdominal pain, blood in stool, constipation, diarrhea, nausea and vomiting.  Genitourinary: Negative.   Musculoskeletal: Negative for arthralgias, joint swelling and myalgias.  Neurological: Negative for dizziness, tremors, seizures, syncope, facial asymmetry, speech difficulty, weakness, light-headedness, numbness and headaches.  Psychiatric/Behavioral: Positive for confusion.     Physical Exam Triage Vital Signs ED Triage Vitals  Enc Vitals Group     BP 02/23/19 1441 (!) 148/68     Pulse Rate 02/23/19 1441 60     Resp 02/23/19 1441 16     Temp 02/23/19 1441 98.7 F (37.1 C)     Temp Source 02/23/19 1441 Oral     SpO2 02/23/19 1441 100 %     Weight --      Height --      Head Circumference --      Peak Flow --      Pain Score 02/23/19 1443 0     Pain Loc --      Pain Edu? --      Excl. in [place]? --    No data found.  Updated Vital Signs BP (!) 148/68 (BP Location: Left Arm)   Pulse 60   Temp 98.7 F (37.1 C) (Oral)   Resp 16   SpO2 100%   Visual Acuity Right Eye Distance:   Left Eye Distance:   Bilateral Distance:    Right Eye Near:   Left Eye Near:    Bilateral Near:     Physical Exam Vitals signs and nursing note reviewed.  Constitutional:      General: She is not in acute distress.    Appearance: Normal appearance. She is not ill-appearing, toxic-appearing or diaphoretic.     Comments: Very pleasant   HENT:     Head: Normocephalic and atraumatic.     Nose: Nose normal.     Mouth/Throat:     Pharynx: Oropharynx is clear.  Eyes:     Conjunctiva/sclera: Conjunctivae normal.     Comments: Pupils reactive but pinpoint  Neck:     Musculoskeletal: Normal range of motion.  Cardiovascular:     Rate and Rhythm: Normal rate and regular rhythm.     Pulses: Normal pulses.     Heart sounds: Normal heart sounds.  Pulmonary:     Effort: Pulmonary effort is normal.     Breath sounds: Normal breath sounds.   Abdominal:     Palpations: Abdomen is soft.     Tenderness: There is no abdominal tenderness.  Musculoskeletal: Normal range of motion.  Skin:    General: Skin is warm and dry.  Findings: No rash.  Neurological:     Mental Status: She is alert. She is disoriented.     GCS: GCS eye subscore is 4. GCS verbal subscore is 5. GCS motor subscore is 6.     Cranial Nerves: No cranial nerve deficit, dysarthria or facial asymmetry.     Sensory: Sensation is intact.     Motor: Weakness present.     Comments: Pt looking to her son to answer all of her questions.  Slow to respond to questions.  Disoriented to place, time No slurred speech or facial droop.  Alert  No unilateral weakness   Psychiatric:        Mood and Affect: Mood normal.        Speech: Speech normal.        Behavior: Behavior is cooperative.        Cognition and Memory: Cognition is impaired. Memory is impaired.      UC Treatments / Results  Labs (all labs ordered are listed, but only abnormal results are displayed) Labs Reviewed  CBC - Abnormal; Notable for the following components:      Result Value   WBC 2.4 (*)    RBC 3.77 (*)    Hemoglobin 10.4 (*)    HCT 33.4 (*)    All other components within normal limits  BASIC METABOLIC PANEL - Abnormal; Notable for the following components:   Creatinine, Ser 1.12 (*)    GFR calc non Af Amer 43 (*)    GFR calc Af Amer 50 (*)    All other components within normal limits  POCT URINALYSIS DIP (DEVICE) - Abnormal; Notable for the following components:   Hgb urine dipstick TRACE (*)    Leukocytes,Ua SMALL (*)    All other components within normal limits  URINE CULTURE    EKG   Radiology No results found.  Procedures Procedures (including critical care time)  Medications Ordered in UC Medications - No data to display  Initial Impression / Assessment and Plan / UC Course  I have reviewed the triage vital signs and the nursing notes.  Pertinent labs &  imaging results that were available during my care of the patient were reviewed by me and considered in my medical decision making (see chart for details).     Patient is a 85 year old female that presents with her son today. She has been living with him for approximately 7 months. He is reporting decreased appetite, weight loss and fatigue over the last couple weeks Reporting that she has been staying hydrated and drinking fluids. She is not complaining of any pain, her vital signs are stable and she is nontoxic or ill-appearing. There is no known history of dementia but patient is unable to answer questions appropriately and disoriented, she is alert and responsive  Looking to her son to answer all of her questions.  Work-up here included CBC, BMP urinalysis and EKG. CBC showed leukopenia which is chronic for her. Mild anemia  BMP with kidney disease which is probably chronic Urine did show leuks and trace hemoglobin.  We will send for culture EKG was normal sinus rhythm with normal rate.  There is nothing acute or concerning here on exam or work-up today. Spoke with son about most likely diagnosis of dementia and this being the cause of her decreasing mental status and decline. May treat for UTI depending on culture.  He is been trying to get her into primary care provider She needs palliative care.  Final Clinical Impressions(s) / UC Diagnoses   Final diagnoses:  Weakness  Anorexia     Discharge Instructions      Sending urine sample for culture EKG did not show anything concerning. Most likely the increased fatigue and loss of appetite is from dementia and part of the aging process.  Make sure she is getting the proper nutrition through boost drinks or protein shakes.  The blood work was not concerning.  She needs a primary care follow up and possible palliative care follow up.  No need to go to the hospital.     ED Prescriptions    None     Controlled  Substance Prescriptions [place] Controlled Substance Registry consulted? Not Applicable   Orvan July, NP 02/24/19 [id number]    Orvan July, NP 02/24/19 [phone]

## 2019-02-24 LAB — URINE CULTURE: Culture: <10000 — AB

## 2019-05-12 IMAGING — CT CT ABD-PELV W/ CM
2 of 5 series · 16 of 46 positions shown, 18 images · IV contrast (APPLIED)
Comparison: None

CLINICAL DATA: Acute generalized abdominal pain, weakness for 2-3
weeks, diarrhea for 2 days; history asthma, diverticulitis, GERD,
hypertension

EXAM:
CT ABDOMEN AND PELVIS WITH CONTRAST
TECHNIQUE: Multidetector CT imaging of the abdomen and pelvis was performed
using the standard protocol following bolus administration of
intravenous contrast. Sagittal and coronal MPR images reconstructed
from axial data set.
CONTRAST:  100mL OMNIPAQUE IOHEXOL 300 MG/ML SOLN IV. No oral
contrast.

[Series 3: abdomen 5.0 · axial · 0.75mm/px · z∈[+796,+1176]mm · 13 of 88 slices shown, 15 images]
[im 6/88  soft-tissue]
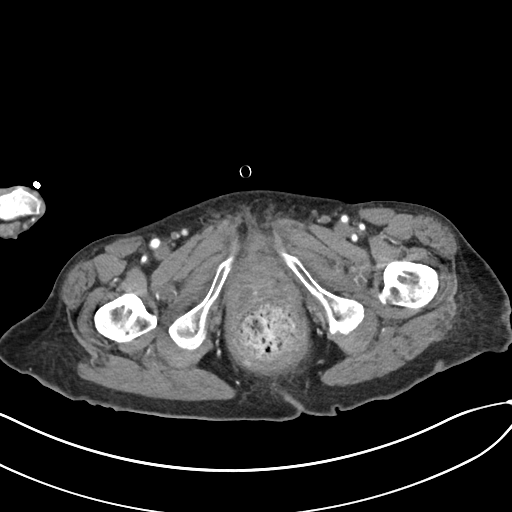
[im 6/88  bone]
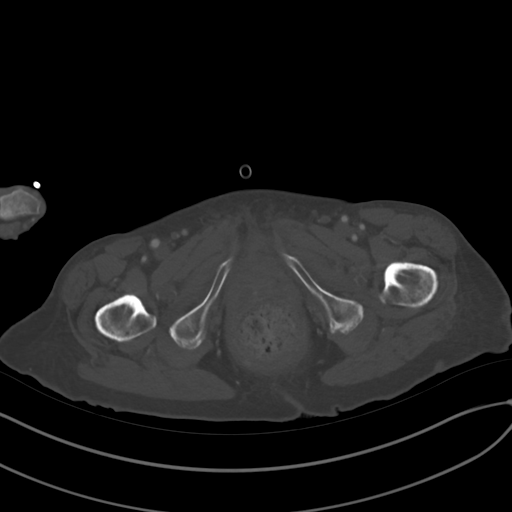
[im 12/88  soft-tissue]
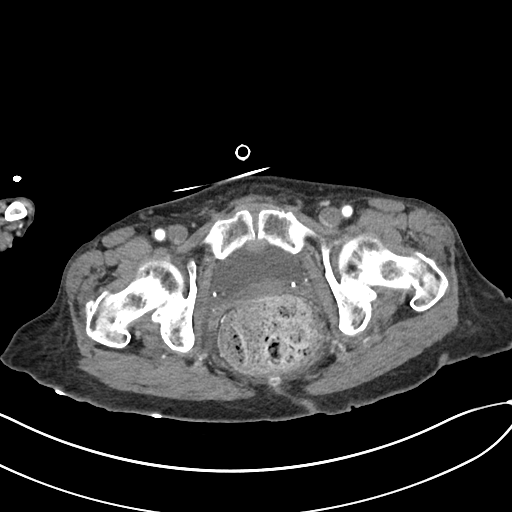
[im 18/88  soft-tissue]
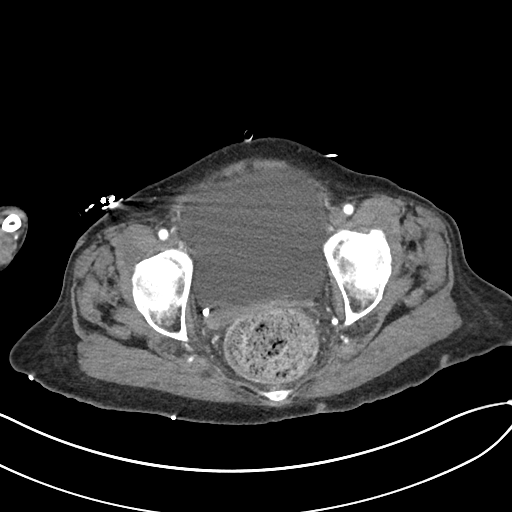
[im 24/88  soft-tissue]
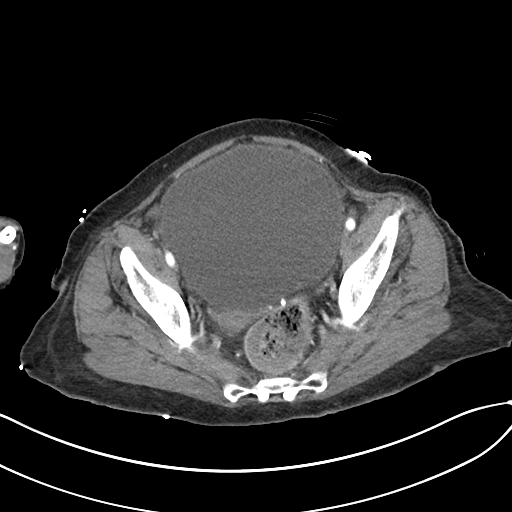
[im 30/88  soft-tissue]
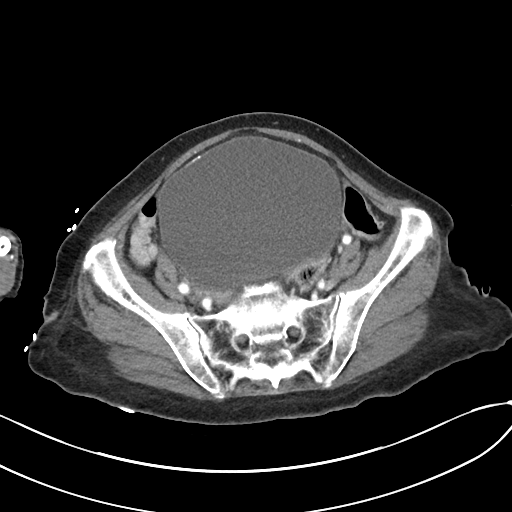
[im 35/88  soft-tissue]
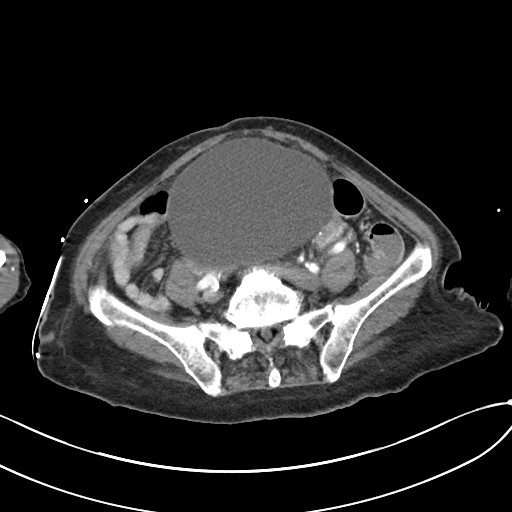
[im 47/88  soft-tissue]
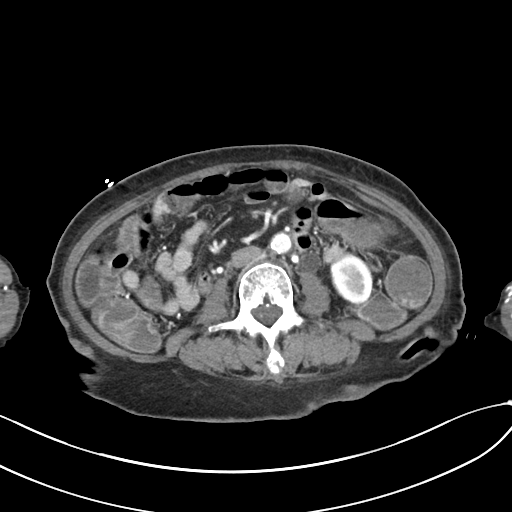
[im 53/88  soft-tissue]
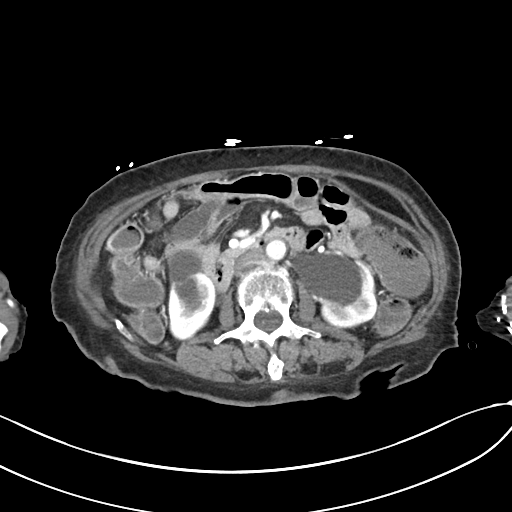
[im 59/88  soft-tissue]
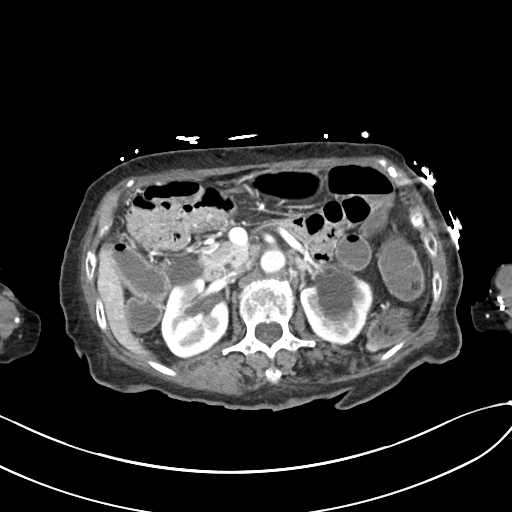
[im 59/88  bone]
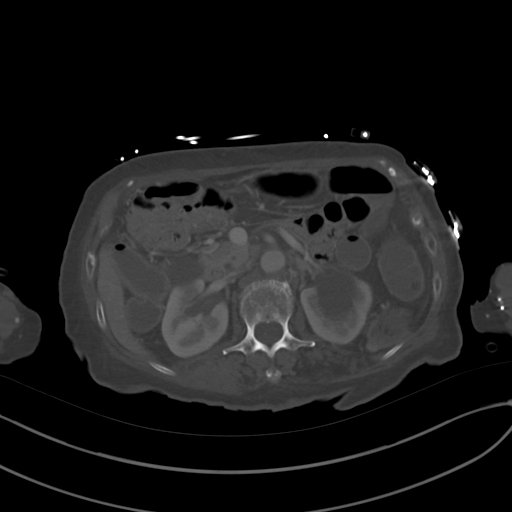
[im 64/88  soft-tissue]
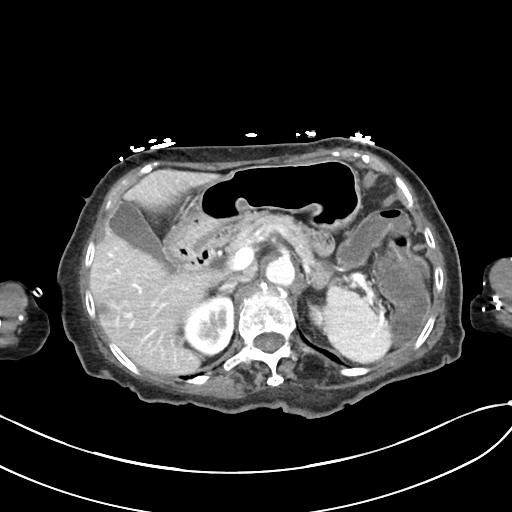
[im 70/88  soft-tissue]
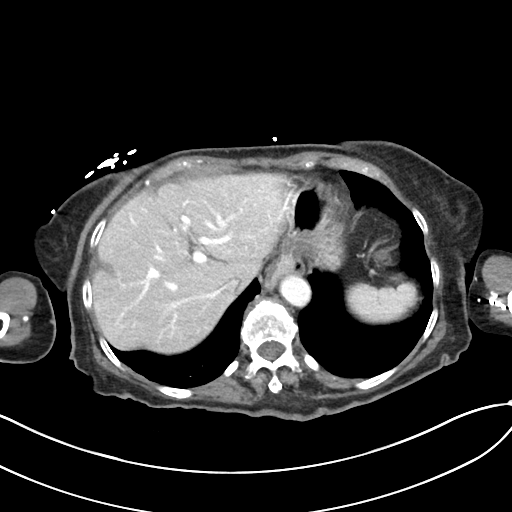
[im 76/88  soft-tissue]
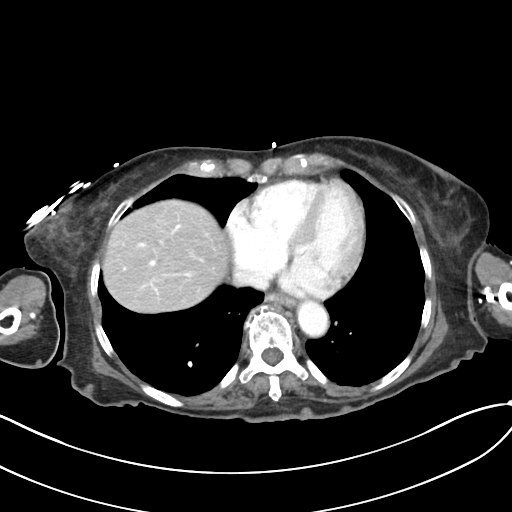
[im 82/88  soft-tissue]
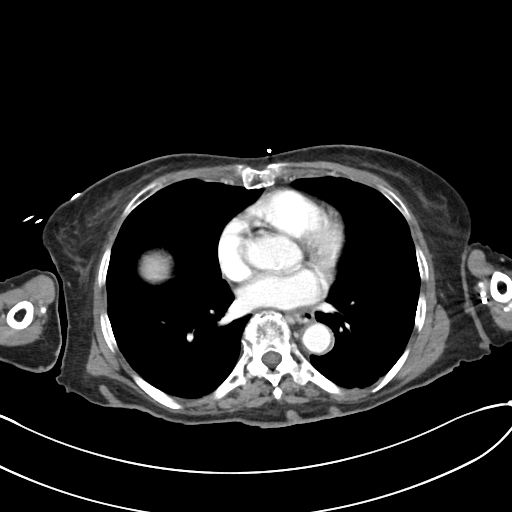

[Series 6: abdomen 3.0 mpr cor · coronal · 0.76mm/px · 3 of 99 slices shown]
[im 33/99  soft-tissue]
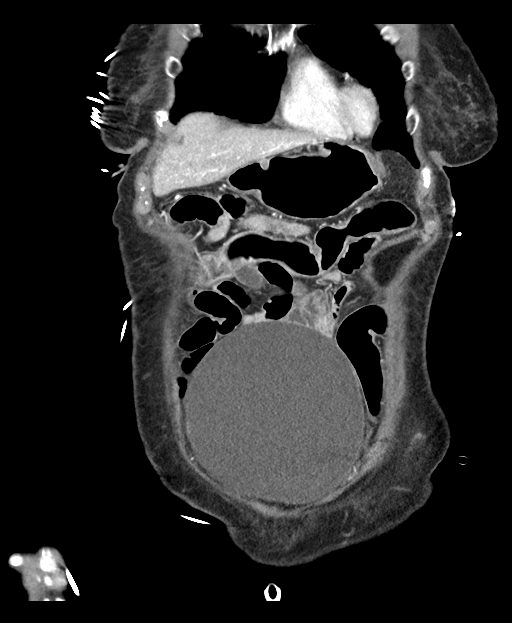
[im 44/99  soft-tissue]
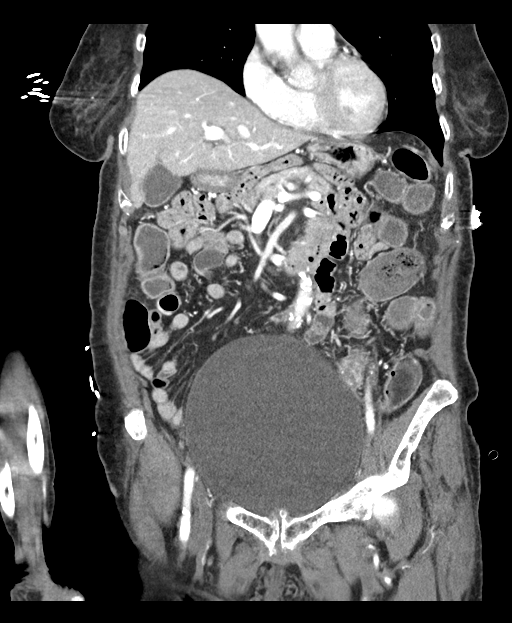
[im 55/99  soft-tissue]
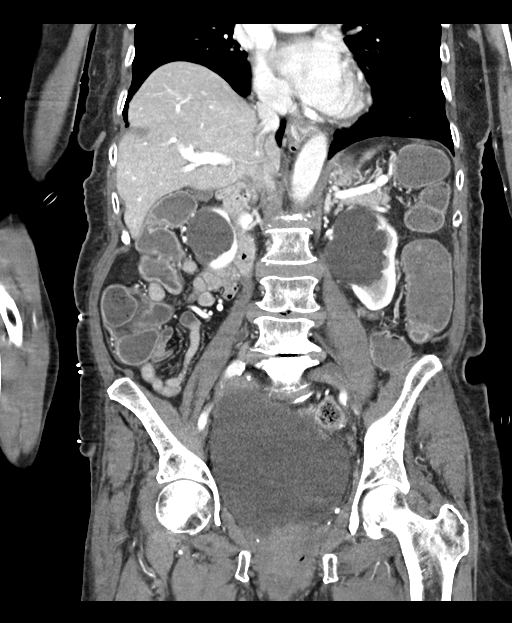

[16 of 46 positions shown; findings below may reference images not displayed]

FINDINGS: Lower chest: Mild bibasilar dependent atelectasis

Hepatobiliary: Gallbladder and liver normal appearance

Pancreas: Atrophic, otherwise unremarkable

Spleen: Normal appearance

Adrenals/Urinary Tract: Adrenal glands normal appearance. Multiple
BILATERAL renal cysts. Moderate LEFT renal collecting system
dilatation and dilatation of proximal LEFT ureter. Marked bladder
distension, reaching above umbilicus, measuring 18.6 x 10.6 x
cm (volume = 9466 cm^3).

Stomach/Bowel: Significantly increased stool within rectum. Mild
distal rectal wall thickening question proctitis/stercoral colitis.
Appendix not identified. And tiny hiatal hernia. Stomach and bowel
loops otherwise normal appearance.

Vascular/Lymphatic: Numerous pelvic phleboliths. Scattered
atherosclerotic calcifications aorta and iliac arteries without
aneurysm. Few scattered coronary arterial calcifications. No
adenopathy.

Reproductive: Atrophic uterus.  Nonvisualization of ovaries.

Other: No free air or free fluid. No hernia or acute inflammatory
process otherwise seen.

Musculoskeletal: Diffuse osseous demineralization.
IMPRESSION: Increased rectal stool with distal rectal wall thickening question
proctitis versus stercoral colitis.

Marked bladder distension with associated dilatation of LEFT renal
collecting system and LEFT proximal ureter.

BILATERAL renal cysts.

## 2019-08-14 ENCOUNTER — Ambulatory Visit (HOSPITAL_COMMUNITY)
Admission: EM | Admit: 2019-08-14 | Discharge: 2019-08-14 | Disposition: A | Discharge status: Home / Self Care | Payer: Medicare Other | Attending: Emergency Medicine | Admitting: Emergency Medicine

## 2019-08-14 ENCOUNTER — Encounter (HOSPITAL_COMMUNITY): Payer: Self-pay

## 2019-08-14 ENCOUNTER — Other Ambulatory Visit: Payer: Self-pay

## 2019-08-14 DIAGNOSIS — Z0189 Encounter for other specified special examinations: Secondary | ICD-10-CM | POA: Diagnosis not present

## 2019-08-14 DIAGNOSIS — R413 Other amnesia: Secondary | ICD-10-CM | POA: Diagnosis not present

## 2019-08-14 LAB — CBC WITH DIFFERENTIAL/PLATELET
Abs Immature Granulocytes: 0.02 10*3/uL (ref 0.00–0.07)
Basophils Absolute: 0 10*3/uL (ref 0.0–0.1)
Basophils Relative: 1 %
Eosinophils Absolute: 0.1 10*3/uL (ref 0.0–0.5)
Eosinophils Relative: 2 %
HCT: 35.6 % — ABNORMAL LOW (ref 36.0–46.0)
Hemoglobin: 11.1 g/dL — ABNORMAL LOW (ref 12.0–15.0)
Immature Granulocytes: 1 %
Lymphocytes Relative: 31 %
Lymphs Abs: 0.8 10*3/uL (ref 0.7–4.0)
MCH: 27.8 pg (ref 26.0–34.0)
MCHC: 31.2 g/dL (ref 30.0–36.0)
MCV: 89.2 fL (ref 80.0–100.0)
Monocytes Absolute: 0.3 10*3/uL (ref 0.1–1.0)
Monocytes Relative: 13 %
Neutro Abs: 1.3 10*3/uL — ABNORMAL LOW (ref 1.7–7.7)
Neutrophils Relative %: 52 %
Platelets: 151 10*3/uL (ref 150–400)
RBC: 3.99 MIL/uL (ref 3.87–5.11)
RDW: 15.4 % (ref 11.5–15.5)
WBC: 2.5 10*3/uL — ABNORMAL LOW (ref 4.0–10.5)
nRBC: 0 % (ref 0.0–0.2)

## 2019-08-14 LAB — BASIC METABOLIC PANEL
Anion gap: 8 (ref 5–15)
BUN: 19 mg/dL (ref 8–23)
CO2: 29 mmol/L (ref 22–32)
Calcium: 9.2 mg/dL (ref 8.9–10.3)
Chloride: 105 mmol/L (ref 98–111)
Creatinine, Ser: 0.97 mg/dL (ref 0.44–1.00)
GFR calc Af Amer: 60 mL/min — ABNORMAL LOW (ref >60)
GFR calc non Af Amer: 51 mL/min — ABNORMAL LOW (ref >60)
Glucose, Bld: 93 mg/dL (ref 70–99)
Potassium: 4.2 mmol/L (ref 3.5–5.1)
Sodium: 142 mmol/L (ref 135–145)

## 2019-08-14 NOTE — Discharge Instructions (Signed)
We will test a few basic labs here today, but ultimately I would like you to establish care with a primary care provider for your regular health maintenance.  You may call any of the clinic's we have provided you.

## 2019-08-14 NOTE — ED Triage Notes (Signed)
Pt states need to be evaluated for "black stuff" in her blood that she has been seeing physician (now retired) for monitoring. Denies any physical complaint. Just wants a "check up". Pt has h/o anemia, leukopenia.

## 2019-08-14 NOTE — ED Provider Notes (Signed)
[facility]    CSN: [id number] Arrival date & time: 08/14/19  1138      History   Chief Complaint Chief Complaint  Patient presents with  . Labs Only    HPI Cassandra English is a 86 y.o. female.   Cassandra English Client presents without specific complaints. She tells nursing she needs routine/follow up blood testing, as she had previously had her blood work followed by her PCP but he has retired. Doesn't have a current PCP. She tells me that she has no complaints and feels well. She lives with her son. She has had to be at home due to the pandemic, which has been lonely for her. She cooks and cleans house but no longer drives. She states she has decreased memory, she is unable to recall names like she used to, for example. During visit she does repeat herself on multiple occasions, with apparent forgetfulness. She is taking a daily baby aspirin. Per chart review, history  Of mild anemia, htn, keukopenia.      ROS per HPI, negative if not otherwise mentioned.      Past Medical History:  Diagnosis Date  . ALLERGIC RHINITIS   . Anemia    with borderline low wbc and neg heme eval 10 years ago.   Marland Kitchen Anxiety   . Asthma   . Callus   . Cataract   . Corns and callosities   . Diverticulitis     1 perf colon 10 99  . GERD (gastroesophageal reflux disease)    EGD 10 99 and 7 00  . HTN (hypertension)   . Hyperlipidemia   . Hypersomnia with sleep apnea    on CPAP  . Leukopenia    chronic. hx of hem consult years ago  . Multinodular goiter 2011   get yearly [place]  . Syncope 2010   echo ok. cards consult    . Wears glasses     Patient Active Problem List   Diagnosis Date Noted  . Pain, foot 06/17/2013  . Callus of foot 11/10/2012  . Onychomycosis due to dermatophyte 11/10/2012  . Fatigue 04/05/2012  . Abnormal thyroid blood test 04/03/2012  . Multinodular goiter 02/20/2012  . Medicare annual wellness visit, subsequent 01/05/2012  . GERD 05/25/2010  . FATIGUE  02/23/2010  . GOITER, MULTINODULAR 02/09/2010  . LEUKOPENIA, CHRONIC 03/24/2009  . OSA (obstructive sleep apnea) 07/10/2007  . HYPERLIPIDEMIA 03/04/2007  . Essential hypertension 03/04/2007    Past Surgical History:  Procedure Laterality Date  . CARPAL TUNNEL RELEASE Right 06/29/2013   Procedure: RIGHT CARPAL TUNNEL RELEASE;  Surgeon: Tennis Must, MD;  Location: [facility];  Service: Orthopedics;  Laterality: Right;  . CATARACT EXTRACTION    . FOOT SURGERY Right 1990's    Hammer toe surgery  . TOTAL ABDOMINAL HYSTERECTOMY      OB History   No obstetric history on file.      Home Medications    Prior to Admission medications   Medication Sig Start Date End Date Taking? Authorizing Provider  aspirin 81 MG tablet Take 81 mg by mouth daily.      [provider]  losartan (COZAAR) 50 MG tablet TAKE ONE-HALF TABLET BY MOUTH EVERY DAY Patient not taking: Reported on 07/12/2018 05/27/12   Panosh, Standley Brooking, MD  NON FORMULARY CPAP machine.  Use as directed at bedtime.    [provider]  polyethylene glycol (MIRALAX) packet Take 17 g by mouth daily. 07/19/18  Gareth Morgan, MD  senna-docusate (SENOKOT-S) 8.6-50 MG tablet Take 1 tablet by mouth 2 (two) times daily as needed for mild constipation. 07/19/18   Gareth Morgan, MD    Family History Family History  Problem Relation Age of Onset  . Coronary artery disease Mother   . Coronary artery disease Father   . Stroke Sister   . Colon cancer Neg Hx     Social History Social History   Tobacco Use  . Smoking status: Never Smoker  . Smokeless tobacco: Never Used  Substance Use Topics  . Alcohol use: No  . Drug use: No     Allergies   Azithromycin, Propoxyphene n-acetaminophen, and Simvastatin   Review of Systems Review of Systems   Physical Exam Triage Vital Signs ED Triage Vitals  Enc Vitals Group     BP 08/14/19 1208 (!) 174/76     Pulse Rate 08/14/19 1208 75     Resp  08/14/19 1208 20     Temp 08/14/19 1208 98.2 F (36.8 C)     Temp Source 08/14/19 1208 Oral     SpO2 08/14/19 1208 100 %     Weight --      Height --      Head Circumference --      Peak Flow --      Pain Score 08/14/19 1204 0     Pain Loc --      Pain Edu? --      Excl. in [place]? --    No data found.  Updated Vital Signs BP (!) 174/76 (BP Location: Right Arm)   Pulse 75   Temp 98.2 F (36.8 C) (Oral)   Resp 20   SpO2 100%   Visual Acuity Right Eye Distance:   Left Eye Distance:   Bilateral Distance:    Right Eye Near:   Left Eye Near:    Bilateral Near:     Physical Exam Constitutional:      General: She is not in acute distress.    Appearance: She is well-developed.  Cardiovascular:     Rate and Rhythm: Normal rate.  Pulmonary:     Effort: Pulmonary effort is normal.  Skin:    General: Skin is warm and dry.  Neurological:     Mental Status: She is alert and oriented to person, place, and time.     Comments: Noted repetition in thought  Psychiatric:        Mood and Affect: Mood normal.      UC Treatments / Results  Labs (all labs ordered are listed, but only abnormal results are displayed) Labs Reviewed  CBC WITH DIFFERENTIAL/PLATELET  BASIC METABOLIC PANEL    EKG   Radiology No results found.  Procedures Procedures (including critical care time)  Medications Ordered in UC Medications - No data to display  Initial Impression / Assessment and Plan / UC Course  I have reviewed the triage vital signs and the nursing notes.  Pertinent labs & imaging results that were available during my care of the patient were reviewed by me and considered in my medical decision making (see chart for details).     Basic labs obtained today, [name] has no acute complaints. Noted some elevation in BP. Multiple resources to establish care with a PCP provided. Patient verbalized understanding and agreeable to plan.    Final Clinical Impressions(s) / UC  Diagnoses   Final diagnoses:  Visit for laboratory test  Poor short term memory  Discharge Instructions     We will test a few basic labs here today, but ultimately I would like you to establish care with a primary care provider for your regular health maintenance.  You may call any of the clinic's we have provided you.     ED Prescriptions    None     PDMP not reviewed this encounter.   Zigmund Gottron, NP 08/14/19 1305

## 2019-09-01 ENCOUNTER — Ambulatory Visit (INDEPENDENT_AMBULATORY_CARE_PROVIDER_SITE_OTHER): Payer: Medicare Other | Admitting: Family Medicine

## 2019-09-01 ENCOUNTER — Encounter: Payer: Self-pay | Admitting: Family Medicine

## 2019-09-01 ENCOUNTER — Other Ambulatory Visit: Payer: Self-pay

## 2019-09-01 VITALS — BP 132/82 | HR 80 | Temp 97.2°F | Ht 62.0 in | Wt 117.6 lb

## 2019-09-01 DIAGNOSIS — D649 Anemia, unspecified: Secondary | ICD-10-CM

## 2019-09-01 DIAGNOSIS — Z23 Encounter for immunization: Secondary | ICD-10-CM | POA: Diagnosis not present

## 2019-09-01 DIAGNOSIS — H9190 Unspecified hearing loss, unspecified ear: Secondary | ICD-10-CM | POA: Diagnosis not present

## 2019-09-01 NOTE — Progress Notes (Signed)
New Patient Office Visit  Subjective:  Patient ID: Cassandra English, female    DOB: 02/04/1927  Age: 86 y.o. MRN: [id number]  CC:  Chief Complaint  Patient presents with  . Establish Care    new patient, no concerns.     HPI Cassandra English presents for establishment of care.  She is accompanied by her grandson.  She is currently living with her son.  The only medicine that she is taking his baby aspirin.  She has no history of vascular disease heart attack or stroke.  Denies any other medication use.  Was seen at urgent care last month.  Chart review shows essentially normal BMP and a mild normocytic anemia.  Patient is doing well.  She denies any pain anywhere.  She is able to achieve all of her ADLs including dressing toileting cooking and cleaning.  She has not had the Covid vaccine.  Past Medical History:  Diagnosis Date  . ALLERGIC RHINITIS   . Anemia    with borderline low wbc and neg heme eval 10 years ago.   Marland Kitchen Anxiety   . Asthma   . Callus   . Cataract   . Corns and callosities   . Diverticulitis     1 perf colon 10 99  . GERD (gastroesophageal reflux disease)    EGD 10 99 and 7 00  . HTN (hypertension)   . Hyperlipidemia   . Hypersomnia with sleep apnea    on CPAP  . Leukopenia    chronic. hx of hem consult years ago  . Multinodular goiter 2011   get yearly [place]  . Syncope 2010   echo ok. cards consult    . Wears glasses     Past Surgical History:  Procedure Laterality Date  . CARPAL TUNNEL RELEASE Right 06/29/2013   Procedure: RIGHT CARPAL TUNNEL RELEASE;  Surgeon: Tennis Must, MD;  Location: [facility];  Service: Orthopedics;  Laterality: Right;  . CATARACT EXTRACTION    . FOOT SURGERY Right 1990's    Hammer toe surgery  . TOTAL ABDOMINAL HYSTERECTOMY      Family History  Problem Relation Age of Onset  . Coronary artery disease Mother   . Coronary artery disease Father   . Stroke Sister   . Colon cancer Neg Hx     Social  History   Socioeconomic History  . Marital status: Divorced    Spouse name: Not on file  . Number of children: 3  . Years of education: Not on file  . Highest education level: Not on file  Occupational History  . Occupation: Retired   Tobacco Use  . Smoking status: Never Smoker  . Smokeless tobacco: Never Used  Substance and Sexual Activity  . Alcohol use: No  . Drug use: No  . Sexual activity: Not on file  Other Topics Concern  . Not on file  Social History Narrative   Lives alone.   [place] [address]    Divorced 3 sons   Environmental consultant signed on    0 caffeine  Drinks daily       Brother died recently age 38s   Social Determinants of Health   Financial Resource Strain:   . Difficulty of Paying Living Expenses: Not on file  Food Insecurity:   . Worried About Charity fundraiser in the Last Year: Not on file  . Ran Out of Food in the Last Year: Not on file  Transportation Needs:   . Film/video editor (Medical): Not on file  . Lack of Transportation (Non-Medical): Not on file  Physical Activity:   . Days of Exercise per Week: Not on file  . Minutes of Exercise per Session: Not on file  Stress:   . Feeling of Stress : Not on file  Social Connections:   . Frequency of Communication with Friends and Family: Not on file  . Frequency of Social Gatherings with Friends and Family: Not on file  . Attends Religious Services: Not on file  . Active Member of Clubs or Organizations: Not on file  . Attends Archivist Meetings: Not on file  . Marital Status: Not on file  Intimate Partner Violence:   . Fear of Current or Ex-Partner: Not on file  . Emotionally Abused: Not on file  . Physically Abused: Not on file  . Sexually Abused: Not on file    ROS Review of Systems  Constitutional: Negative.   HENT: Positive for hearing loss.   Eyes: Negative for photophobia and visual disturbance.  Respiratory: Negative.   Cardiovascular: Negative.     Gastrointestinal: Negative.   Genitourinary: Negative.   Musculoskeletal: Negative for arthralgias, gait problem and myalgias.  Skin: Negative for pallor and rash.  Allergic/Immunologic: Negative for immunocompromised state.  Neurological: Negative for light-headedness and headaches.  Hematological: Negative.  Does not bruise/bleed easily.  Psychiatric/Behavioral: Negative.     Objective:   Today's Vitals: BP 132/82   Pulse 80   Temp (!) 97.2 F (36.2 C) (Tympanic)   Ht 5\' 2"  (1.575 m)   Wt 117 lb 9.6 oz (53.3 kg)   SpO2 99%   BMI 21.51 kg/m   Physical Exam Vitals and nursing note reviewed.  Constitutional:      General: She is not in acute distress.    Appearance: Normal appearance. She is normal weight. She is not ill-appearing, toxic-appearing or diaphoretic.  HENT:     Head: Normocephalic and atraumatic.     Right Ear: Tympanic membrane, ear canal and external ear normal. There is no impacted cerumen.     Left Ear: Tympanic membrane, ear canal and external ear normal. There is no impacted cerumen.     Nose: No congestion.  Eyes:     General: No scleral icterus.       Right eye: No discharge.        Left eye: No discharge.     Extraocular Movements: Extraocular movements intact.     Conjunctiva/sclera: Conjunctivae normal.     Pupils: Pupils are equal, round, and reactive to light.  Cardiovascular:     Rate and Rhythm: Normal rate and regular rhythm.  Pulmonary:     Effort: Pulmonary effort is normal. No respiratory distress.     Breath sounds: Normal breath sounds. No stridor. No wheezing, rhonchi or rales.  Abdominal:     General: Bowel sounds are normal.  Musculoskeletal:     Cervical back: No rigidity or tenderness.  Lymphadenopathy:     Cervical: No cervical adenopathy.  Skin:    General: Skin is warm and dry.  Neurological:     Mental Status: She is alert and oriented to person, place, and time.  Psychiatric:        Mood and Affect: Mood normal.         Behavior: Behavior normal.     Assessment & Plan:   Problem List Items Addressed This Visit      Nervous and Auditory  Hearing loss - Primary   Relevant Orders   Ambulatory referral to Audiology     Other   Anemia      Outpatient Encounter Medications as of 09/01/2019  Medication Sig  . aspirin 81 MG tablet Take 81 mg by mouth daily.    . polyethylene glycol (MIRALAX) packet Take 17 g by mouth daily.  Marland Kitchen senna-docusate (SENOKOT-S) 8.6-50 MG tablet Take 1 tablet by mouth 2 (two) times daily as needed for mild constipation.  Marland Kitchen losartan (COZAAR) 50 MG tablet TAKE ONE-HALF TABLET BY MOUTH EVERY DAY (Patient not taking: Reported on 07/12/2018)  . NON FORMULARY CPAP machine.  Use as directed at bedtime.   No facility-administered encounter medications on file as of 09/01/2019.    Follow-up: Return in about 3 months (around 11/29/2019).  Have asked for an audiology consult for the possibility of hearing augmentation.  Yolanda Bonine will check on whether or not patient is being treated for hypertension with losartan.  They will let us know.  History.  Information on obtaining the Covid vaccine.  Follow-up in 3 months.  Discussed that aspirin is not indicated with her given history.  Libby Maw, MD

## 2019-11-04 ENCOUNTER — Telehealth: Payer: Self-pay | Admitting: Family Medicine

## 2019-11-04 NOTE — Telephone Encounter (Signed)
Attempted to schedule AWV. Unable to LVM.  Will try at later time.  

## 2019-11-30 ENCOUNTER — Ambulatory Visit: Payer: Medicare Other | Admitting: Family Medicine

## 2019-12-15 ENCOUNTER — Other Ambulatory Visit: Payer: Self-pay

## 2019-12-15 ENCOUNTER — Encounter: Payer: Self-pay | Admitting: Family Medicine

## 2019-12-15 ENCOUNTER — Ambulatory Visit (INDEPENDENT_AMBULATORY_CARE_PROVIDER_SITE_OTHER): Payer: Medicare Other | Admitting: Family Medicine

## 2019-12-15 VITALS — BP 138/78 | HR 68 | Temp 97.9°F | Ht 62.0 in | Wt 119.6 lb

## 2019-12-15 DIAGNOSIS — D649 Anemia, unspecified: Secondary | ICD-10-CM | POA: Diagnosis not present

## 2019-12-15 LAB — CBC
HCT: 34 % — ABNORMAL LOW (ref 36.0–46.0)
Hemoglobin: 11.1 g/dL — ABNORMAL LOW (ref 12.0–15.0)
MCHC: 32.5 g/dL (ref 30.0–36.0)
MCV: 86.5 fl (ref 78.0–100.0)
Platelets: 146 10*3/uL — ABNORMAL LOW (ref 150.0–400.0)
RBC: 3.94 Mil/uL (ref 3.87–5.11)
RDW: 15.3 % (ref 11.5–15.5)
WBC: 2.4 10*3/uL — ABNORMAL LOW (ref 4.0–10.5)

## 2019-12-15 NOTE — Progress Notes (Signed)
Established Patient Office Visit  Subjective:  Patient ID: Cassandra English, female    DOB: [birth year]  Age: 86 y.o. MRN: [id number]  CC:  Chief Complaint  Patient presents with  . Follow-up    3 month follow, no concerns.     HPI Cassandra English presents for follow-up of her normocytic anemia.  Turns out she is not taking losartan.  Continues to take an aspirin because she has done so for years.  Denies blood or dark stools.  She has not had the Covid vaccine.  Denies pain.  Continues to perform her ADLs without issue.  Tells me that she tires easily and sometimes forgets.  She sleeps on one pillow and has no shortness of breath or chest pain.  Past Medical History:  Diagnosis Date  . ALLERGIC RHINITIS   . Anemia    with borderline low wbc and neg heme eval 10 years ago.   Marland Kitchen Anxiety   . Asthma   . Callus   . Cataract   . Corns and callosities   . Diverticulitis     1 perf colon 10 99  . GERD (gastroesophageal reflux disease)    EGD 10 99 and 7 00  . HTN (hypertension)   . Hyperlipidemia   . Hypersomnia with sleep apnea    on CPAP  . Leukopenia    chronic. hx of hem consult years ago  . Multinodular goiter 2011   get yearly [place]  . Syncope 2010   echo ok. cards consult    . Wears glasses     Past Surgical History:  Procedure Laterality Date  . CARPAL TUNNEL RELEASE Right 06/29/2013   Procedure: RIGHT CARPAL TUNNEL RELEASE;  Surgeon: Tennis Must, MD;  Location: [facility];  Service: Orthopedics;  Laterality: Right;  . CATARACT EXTRACTION    . FOOT SURGERY Right 1990's    Hammer toe surgery  . TOTAL ABDOMINAL HYSTERECTOMY      Family History  Problem Relation Age of Onset  . Coronary artery disease Mother   . Coronary artery disease Father   . Stroke Sister   . Colon cancer Neg Hx     Social History   Socioeconomic History  . Marital status: Divorced    Spouse name: Not on file  . Number of children: 3  . Years of education: Not on  file  . Highest education level: Not on file  Occupational History  . Occupation: Retired   Tobacco Use  . Smoking status: Never Smoker  . Smokeless tobacco: Never Used  Substance and Sexual Activity  . Alcohol use: No  . Drug use: No  . Sexual activity: Not on file  Other Topics Concern  . Not on file  Social History Narrative   Lives alone.   [place] [address] [place]    Divorced 3 sons   Environmental consultant signed on    0 caffeine  Drinks daily       Brother died recently age 74s   Social Determinants of Health   Financial Resource Strain:   . Difficulty of Paying Living Expenses:   Food Insecurity:   . Worried About Charity fundraiser in the Last Year:   . Arboriculturist in the Last Year:   Transportation Needs:   . Film/video editor (Medical):   Marland Kitchen Lack of Transportation (Non-Medical):   Physical Activity:   . Days of Exercise per Week:   . Minutes  of Exercise per Session:   Stress:   . Feeling of Stress :   Social Connections:   . Frequency of Communication with Friends and Family:   . Frequency of Social Gatherings with Friends and Family:   . Attends Religious Services:   . Active Member of Clubs or Organizations:   . Attends Archivist Meetings:   Marland Kitchen Marital Status:   Intimate Partner Violence:   . Fear of Current or Ex-Partner:   . Emotionally Abused:   Marland Kitchen Physically Abused:   . Sexually Abused:     Outpatient Medications Prior to Visit  Medication Sig Dispense Refill  . aspirin 81 MG tablet Take 81 mg by mouth daily.      . polyethylene glycol (MIRALAX) packet Take 17 g by mouth daily. 14 each 0  . NON FORMULARY CPAP machine.  Use as directed at bedtime.    Marland Kitchen losartan (COZAAR) 50 MG tablet TAKE ONE-HALF TABLET BY MOUTH EVERY DAY (Patient not taking: Reported on 07/12/2018) 30 tablet 5  . senna-docusate (SENOKOT-S) 8.6-50 MG tablet Take 1 tablet by mouth 2 (two) times daily as needed for mild constipation. (Patient not taking:  Reported on 12/15/2019) 30 tablet 0   No facility-administered medications prior to visit.    Allergies  Allergen Reactions  . Azithromycin   . Propoxyphene N-Acetaminophen   . Simvastatin     REACTION: unspecified    ROS Review of Systems  HENT: Negative.   Eyes: Negative for photophobia and visual disturbance.  Respiratory: Negative.   Cardiovascular: Negative.   Gastrointestinal: Negative.  Negative for anal bleeding and blood in stool.  Endocrine: Negative for polyphagia and polyuria.  Genitourinary: Negative.  Negative for hematuria.  Musculoskeletal: Negative for arthralgias and joint swelling.  Neurological: Negative for dizziness and light-headedness.  Hematological: Does not bruise/bleed easily.  Psychiatric/Behavioral: Negative.       Objective:    Physical Exam  Constitutional: She is oriented to person, place, and time. She appears well-developed and well-nourished. No distress.  HENT:  Head: Normocephalic and atraumatic.  Right Ear: External ear normal.  Left Ear: External ear normal.  Eyes: Right eye exhibits no discharge. Left eye exhibits no discharge. No scleral icterus.  Neck: No JVD present. No tracheal deviation present.  Cardiovascular: Normal rate, regular rhythm and normal heart sounds.  Pulmonary/Chest: Effort normal and breath sounds normal. No stridor.  Musculoskeletal:        General: No tenderness.  Neurological: She is alert and oriented to person, place, and time.  Skin: Skin is warm and dry. She is not diaphoretic.  Psychiatric: She has a normal mood and affect. Her behavior is normal.    BP 138/78   Pulse 68   Temp 97.9 F (36.6 C) (Tympanic)   Ht 5\' 2"  (1.575 m)   Wt 119 lb 9.6 oz (54.3 kg)   SpO2 97%   BMI 21.88 kg/m  Wt Readings from Last 3 Encounters:  12/15/19 119 lb 9.6 oz (54.3 kg)  09/01/19 117 lb 9.6 oz (53.3 kg)  05/09/17 131 lb 12.8 oz (59.8 kg)     Health Maintenance Due  Topic Date Due  . COVID-19 Vaccine  (1) Never done  . DEXA SCAN  Never done  . PNA vac Low Risk Adult (2 of 2 - PCV13) 07/30/2001  . MAMMOGRAM  06/30/2013    There are no preventive care reminders to display for this patient.  Lab Results  Component Value Date   TSH 1.838  11/13/2017   Lab Results  Component Value Date   WBC 2.5 (L) 08/14/2019   HGB 11.1 (L) 08/14/2019   HCT 35.6 (L) 08/14/2019   MCV 89.2 08/14/2019   PLT 151 08/14/2019   Lab Results  Component Value Date   NA 142 08/14/2019   K 4.2 08/14/2019   CO2 29 08/14/2019   GLUCOSE 93 08/14/2019   BUN 19 08/14/2019   CREATININE 0.97 08/14/2019   BILITOT 0.7 07/12/2018   ALKPHOS 69 07/12/2018   AST 51 (H) 07/12/2018   ALT 44 07/12/2018   PROT 6.8 07/12/2018   ALBUMIN 3.6 07/12/2018   CALCIUM 9.2 08/14/2019   ANIONGAP 8 08/14/2019   GFR 62.31 01/02/2012   Lab Results  Component Value Date   CHOL 199 01/02/2012   Lab Results  Component Value Date   HDL 66.00 01/02/2012   Lab Results  Component Value Date   LDLCALC 113 (H) 01/02/2012   Lab Results  Component Value Date   TRIG 99.0 01/02/2012   Lab Results  Component Value Date   CHOLHDL 3 01/02/2012   Lab Results  Component Value Date   HGBA1C 6.3 11/18/2008      Assessment & Plan:   Problem List Items Addressed This Visit      Other   Anemia - Primary   Relevant Orders   CBC      No orders of the defined types were placed in this encounter.   Follow-up: Return in about 3 months (around 03/16/2020), or start multivitamin with iron., for Advise Covid vaccine. .  Start multivitamin with iron. May stop asa.   Libby Maw, MD

## 2020-03-16 ENCOUNTER — Ambulatory Visit: Payer: Medicare Other | Admitting: Family Medicine

## 2020-04-26 ENCOUNTER — Telehealth: Payer: Self-pay | Admitting: Family Medicine

## 2020-04-26 NOTE — Telephone Encounter (Signed)
Reached out to patient regarding cancelled appt from 03/16/2020. Left voicemail to call the office for reschedule.

## 2020-09-16 ENCOUNTER — Ambulatory Visit (HOSPITAL_COMMUNITY)
Admission: EM | Admit: 2020-09-16 | Discharge: 2020-09-16 | Discharge status: Against Medical Advice | Payer: Medicare Other

## 2021-01-10 ENCOUNTER — Ambulatory Visit (HOSPITAL_COMMUNITY)
Admission: EM | Admit: 2021-01-10 | Discharge: 2021-01-10 | Disposition: A | Discharge status: Home / Self Care | Payer: Medicare Other | Attending: Emergency Medicine | Admitting: Emergency Medicine

## 2021-01-10 ENCOUNTER — Other Ambulatory Visit: Payer: Self-pay

## 2021-01-10 ENCOUNTER — Encounter (HOSPITAL_COMMUNITY): Payer: Self-pay | Admitting: Emergency Medicine

## 2021-01-10 DIAGNOSIS — I10 Essential (primary) hypertension: Secondary | ICD-10-CM

## 2021-01-10 DIAGNOSIS — Z0000 Encounter for general adult medical examination without abnormal findings: Secondary | ICD-10-CM | POA: Diagnosis not present

## 2021-01-10 DIAGNOSIS — R413 Other amnesia: Secondary | ICD-10-CM

## 2021-01-10 NOTE — Discharge Instructions (Addendum)
Your blood pressure was high today. decrease your salt intake. diet and exercise will lower your blood pressure significantly. It is important to keep your blood pressure under good control, as having a elevated blood pressure for prolonged periods of time significantly increases your risk of stroke, heart attacks, kidney damage, eye damage, and other problems. Measure your blood pressure once a day, preferably at the same time every day. Keep a log of this and bring it to your next doctor's appointment.  Bring your blood pressure cuff as well.  Please follow-up with Dr. Ethelene Hal in a week or 2.  You may need to be restarted on blood pressure medications.  Return immediately to the ER if you start having chest pain, headache, problems seeing, problems talking, problems walking, if you feel like you're about to pass out, if you do pass out, if you have a seizure, or for any other concerns.  Go to www.goodrx.com  or www.costplusdrugs.com to look up your medications. This will give you a list of where you can find your prescriptions at the most affordable prices. Or ask the pharmacist what the cash price is, or if they have any other discount programs available to help make your medication more affordable. This can be less expensive than what you would pay with insurance.

## 2021-01-10 NOTE — ED Triage Notes (Signed)
Patient presents to [facility] for evaluation of fatigue x "too long to remember".  States she gets checked out every 6 months, does vitals.  Patient denies any changes in the laast 6 months.

## 2021-01-10 NOTE — ED Provider Notes (Signed)
HPI  SUBJECTIVE:  Cassandra English is a 87 y.o. female who presents for general physical evaluation.  She states that she is supposed to see a doctor every 6 months and have her vitals checked.  Her neighbor accompanies her today.  Patient has no complaints.  No fevers, cough, chest pain, shortness of breath, dyspnea on exertion, wheezing, headache, nausea, vomiting, diarrhea, abdominal pain, urinary complaints.  She is having normal bowel movements.  No lower extremity edema.  Per chart review, patient has a past medical history of hypercholesterolemia, asthma, anemia, hypertension was on losartan in the past.  Patient states that she does not take any medications at all and does not remember the last time that she was on blood pressure medications.  Her neighbor is not aware that the patient has any medical problems,, and he states that he has known her for some time.  Patient seems to have some issues with short-term memory, additional history obtained from her neighbor.  [id number], Mortimer Fries, MD   Past Medical History:  Diagnosis Date   ALLERGIC RHINITIS    Anemia    with borderline low wbc and neg heme eval 10 years ago.    Anxiety    Asthma    Callus    Cataract    Corns and callosities    Diverticulitis     1 perf colon 10 99   GERD (gastroesophageal reflux disease)    EGD 10 99 and 7 00   HTN (hypertension)    Hyperlipidemia    Hypersomnia with sleep apnea    on CPAP   Leukopenia    chronic. hx of hem consult years ago   Multinodular goiter 2011   get yearly [place]   Syncope 2010   echo ok. cards consult     Wears glasses     Past Surgical History:  Procedure Laterality Date   CARPAL TUNNEL RELEASE Right 06/29/2013   Procedure: RIGHT CARPAL TUNNEL RELEASE;  Surgeon: Tennis Must, MD;  Location: [facility];  Service: Orthopedics;  Laterality: Right;   CATARACT EXTRACTION     FOOT SURGERY Right 1990's    Hammer toe surgery   TOTAL ABDOMINAL  HYSTERECTOMY      Family History  Problem Relation Age of Onset   Coronary artery disease Mother    Coronary artery disease Father    Stroke Sister    Colon cancer Neg Hx     Social History   Tobacco Use   Smoking status: Never   Smokeless tobacco: Never  Substance Use Topics   Alcohol use: No   Drug use: No    No current facility-administered medications for this encounter.  Current Outpatient Medications:    aspirin 81 MG tablet, Take 81 mg by mouth daily.  , Disp: , Rfl:    NON FORMULARY, CPAP machine.  Use as directed at bedtime., Disp: , Rfl:    polyethylene glycol (MIRALAX) packet, Take 17 g by mouth daily., Disp: 14 each, Rfl: 0  Allergies  Allergen Reactions   Azithromycin    Propoxyphene N-Acetaminophen    Simvastatin     REACTION: unspecified     ROS  As noted in HPI.   Physical Exam  BP (!) 196/98 (BP Location: Right Arm)   Pulse 71   Temp 98 F (36.7 C) (Oral)   Resp 16   SpO2 99%   BP Readings from Last 3 Encounters:  01/10/21 (!) 196/98  12/15/19 138/78  09/01/19 132/82  Constitutional: Well developed, well nourished, no acute distress Eyes:  EOMI, conjunctiva normal bilaterally HENT: Normocephalic, atraumatic,mucus membranes moist Respiratory: Normal inspiratory effort, lungs clear bilaterally. Cardiovascular: Normal rate, regular rhythm, no murmurs rubs or gallop GI: nondistended, soft, nontender, active bowel sounds, no rebound, guarding Back: No CVAT skin: No rash, skin intact Musculoskeletal: no deformities Neurologic: Alert & oriented x 3, no focal neuro deficits Psychiatric: Speech and behavior appropriate   ED Course   Medications - No data to display  No orders of the defined types were placed in this encounter.   No results found for this or any previous visit (from the past 24 hour(s)). No results found.  ED Clinical Impression  1. Elevated blood pressure reading in office with diagnosis of hypertension    2. Poor short term memory   3. Normal physical exam      ED Assessment/Plan  Patient has no specific complaints today, but states that she wanted "her vitals checked" as she is supposed to do every 6 months.  Discussed with her and neighbor who accompanies her today that the last visit on record that she had with a healthcare provider was in May 2021, and that while it is a good idea to see a healthcare provider every 6 months, that would be ideal if she following up with her primary care provider. Patient appears to have poor short-term memory, but I am unsure if this has changed recently. I do not think that she needs any emergent labs today.  Blood pressure noted however, patient has no complaints or no signs or symptoms of end organ damage.  Discussed with them the risks of having elevated blood pressure including MI, stroke.  She does not measure her blood pressure at home, so I am not sure what her baseline is.  It was normal on her previous 2 visits when she states she was not on any antihypertensives.  Advised patient and neighbor to get a blood pressure cuff and keep a log of it.  They will need to follow-up with her PMD in 2 weeks to have this looked at and to be restarted on medication if necessary.  Discussed and gave written instructions on signs and symptoms of hypertensive emergency with patient and neighbor.   Discussed MDM, treatment plan, and plan for follow-up with patient and neighbor.  Discussed sn/sx that should prompt return to the ED. they agree with plan  No orders of the defined types were placed in this encounter.     *This clinic note was created using Dragon dictation software. Therefore, there may be occasional mistakes despite careful proofreading.  ?    Melynda Ripple, MD 01/10/21 1407

## 2021-03-10 ENCOUNTER — Encounter: Payer: Self-pay | Admitting: Podiatry

## 2021-03-10 ENCOUNTER — Ambulatory Visit (INDEPENDENT_AMBULATORY_CARE_PROVIDER_SITE_OTHER): Payer: Medicare Other | Admitting: Podiatry

## 2021-03-10 ENCOUNTER — Other Ambulatory Visit: Payer: Self-pay

## 2021-03-10 DIAGNOSIS — M2041 Other hammer toe(s) (acquired), right foot: Secondary | ICD-10-CM

## 2021-03-10 DIAGNOSIS — I739 Peripheral vascular disease, unspecified: Secondary | ICD-10-CM

## 2021-03-10 DIAGNOSIS — B351 Tinea unguium: Secondary | ICD-10-CM | POA: Diagnosis not present

## 2021-03-10 DIAGNOSIS — L84 Corns and callosities: Secondary | ICD-10-CM

## 2021-03-10 DIAGNOSIS — M79675 Pain in left toe(s): Secondary | ICD-10-CM | POA: Diagnosis not present

## 2021-03-10 DIAGNOSIS — M2042 Other hammer toe(s) (acquired), left foot: Secondary | ICD-10-CM

## 2021-03-10 DIAGNOSIS — M79674 Pain in right toe(s): Secondary | ICD-10-CM

## 2021-03-10 NOTE — Patient Instructions (Signed)
Onychomycosis/Fungal Toenails  WHAT IS IT? An infection that lies within the keratin of your nail plate that is caused by a fungus.  WHY ME? Fungal infections affect all ages, sexes, races, and creeds.  There may be many factors that predispose you to a fungal infection such as age, coexisting medical conditions such as diabetes, or an autoimmune disease; stress, medications, fatigue, genetics, etc.  Bottom line: fungus thrives in a warm, moist environment and your shoes offer such a location.  IS IT CONTAGIOUS? Theoretically, yes.  You do not want to share shoes, nail clippers or files with someone who has fungal toenails.  Walking around barefoot in the same room or sleeping in the same bed is unlikely to transfer the organism.  It is important to realize, however, that fungus can spread easily from one nail to the next on the same foot.  HOW DO WE TREAT THIS?  There are several ways to treat this condition.  Treatment may depend on many factors such as age, medications, pregnancy, liver and kidney conditions, etc.  It is best to ask your doctor which options are available to you.  No treatment.   Unlike many other medical concerns, you can live with this condition.  However for many people this can be a painful condition and may lead to ingrown toenails or a bacterial infection.  It is recommended that you keep the nails cut short to help reduce the amount of fungal nail. Topical treatment.  These range from herbal remedies to prescription strength nail lacquers.  About 40-50% effective, topicals require twice daily application for approximately 9 to 12 months or until an entirely new nail has grown out.  The most effective topicals are medical grade medications available through physicians offices. Oral antifungal medications.  With an 80-90% cure rate, the most common oral medication requires 3 to 4 months of therapy and stays in your system for a year as the new nail grows out.  Oral antifungal  medications do require blood work to make sure it is a safe drug for you.  A liver function panel will be performed prior to starting the medication and after the first month of treatment.  It is important to have the blood work performed to avoid any harmful side effects.  In general, this medication safe but blood work is required. Laser Therapy.  This treatment is performed by applying a specialized laser to the affected nail plate.  This therapy is noninvasive, fast, and non-painful.  It is not covered by insurance and is therefore, out of pocket.  The results have been very good with a 80-95% cure rate.  [facility] is the only practice in the area to offer this therapy. Permanent Nail Avulsion.  Removing the entire nail so that a new nail will not grow back.   https://orthoinfo.aaos.org/en/diseases--conditions/hammer-toe">  Hammer Toe Hammer toe is a change in the shape, or a deformity, of the toe. The deformity causes the middle joint of the toe to stay bent. Hammer toe starts gradually. At first, the toe can be straightened. Then over time, the toe deformity becomes stiff, inflexible, and permanently bent. Hammer toe usually affects thesecond, third, or fourth toe. A hammer toe causes pain, especially when wearing shoes. Corns and calluses canresult from the toe rubbing against the inside of the shoe. Early treatments to keep the toe straight may relieve pain. As the deformity of the toe becomes stiff and permanent, surgery may be needed to straighten thetoe. What  are the causes? This condition is caused by abnormal bending of the toe joint that is closest to your foot. Over time, the toe bending downward pulls on the muscles and connections (tendons) of the toe joint, making them weak and stiff. Wearing shoes that are too narrow in the toe box and do not allow toes to fully straighten can cause thiscondition. What increases the risk? You are more likely to develop this condition if  you: Are an older female. Wear shoes that are too small, or wear high-heeled shoes that pinch your toes. Have a second toe that is longer than your big toe (first toe). Injure your foot or toe. Have arthritis, or have a nerve or muscle disorder. Have diabetes or a condition known as Charcot joint, which may cause you to walk abnormally. Have a family history of hammer toe. Are a Engineer, mining. What are the signs or symptoms? Pain and deformity of the toe are the main symptoms of this condition. The pain is worse when wearing shoes, walking, or running. Other symptoms may include: A thickened patch of skin, called a corn or callus, that forms over the top of the bent part of the toe or between the toes. Redness and a burning feeling on the bent toe. An open sore that forms on the top of the bent toe. Not being able to straighten the affected toe. How is this diagnosed? This condition is diagnosed based on your symptoms and a physical exam. During the exam, your health care provider will try to straighten your toe to see how stiff the deformity is. You may also have tests, such as: A blood test to check for rheumatoid arthritis or diabetes. An X-ray to show how severe the toe deformity is. How is this treated? Treatment for this condition depends on whether the toe is flexible or deformed and no longer moveable. In less severe cases, a hammer toe can be straightened without surgery. These treatments include: Taping the toe into a straightened position. Using pads and cushions to protect the bent toe. Wearing shoes that provide enough room for the toes. Doing toe-stretching exercises at home. Taking an NSAID, such as ibuprofen, to reduce pain and swelling. Using special orthotics or insoles for pain relief and to improve walking. If these treatments do not help or the toe has a severe deformity and cannot be straightened, surgery is the next option. The most common surgeries used to  straighten a hammer toe include: Arthroplasty or osteotomy. Part of the toe joint is reconstructed or removed, which allows the toe to straighten. Fusion. Cartilage between the two bones of the joint is taken out, and the bones are fused together into one longer bone. Implantation. Part of the bone is removed and replaced with an implant to allow the toe to move again. Flexor tendon transfer. The tendons that curl the toes down (flexor tendons) are repositioned. Follow these instructions at home: Take over-the-counter and prescription medicines only as told by your health care provider. Do toe-straightening and stretching exercises as told by your health care provider. Keep all follow-up visits. This is important. How is this prevented? Wear shoes that fit properly and give your toes enough room. Shoes should not cause pain. Buy shoes at the end of the day to make sure they fit well, since your foot may swell during the day. Make sure they are comfortable before you buy them. As you age, your shoe size might change, including the width. Measure both feet and  buy shoes for the larger foot. A shoe repair store might be able to stretch shoes that feel tight in spots. Do not wear high-heeled shoes or shoes with pointed toes. Contact a health care provider if: Your pain gets worse. Your toe becomes red or swollen. You develop an open sore on your toe. Summary Hammer toe is a condition that gradually causes your toe to become bent and stiff. Hammer toe can be treated by taping the toe into a straightened position and doing toe-stretching exercises. If these treatments do not help, surgery may be needed. To prevent this condition, wear shoes that fit properly, give your toes enough room, and do not cause pain. This information is not intended to replace advice given to you by your health care provider. Make sure you discuss any questions you have with your healthcare provider. Document Revised:  10/22/2019 Document Reviewed: 10/22/2019 Elsevier Patient Education  2022 [place] are small areas of thickened skin that form on the top, sides, or tip of a toe. Corns have a cone-shaped core with a point that can press on a nervebelow. This causes pain. Calluses are areas of thickened skin that can form anywhere on the body, including the hands, fingers, palms, soles of the feet, and heels. Calluses areusually larger than corns. What are the causes? Corns and calluses are caused by rubbing (friction) or pressure, such as from shoes that are too tight or do not fit properly. What increases the risk? Corns are more likely to develop in people who have misshapen toes (toe deformities), such as hammer toes. Calluses can form with friction to any area of the skin. They are more likely to develop in people who: Work with their hands. Wear shoes that fit poorly, are too tight, or are high-heeled. Have toe deformities. What are the signs or symptoms? Symptoms of a corn or callus include: A hard growth on the skin. Pain or tenderness under the skin. Redness and swelling. Increased discomfort while wearing tight-fitting shoes, if your feet are affected. If a corn or callus becomes infected, symptoms may include: Redness and swelling that gets worse. Pain. Fluid, blood, or pus draining from the corn or callus. How is this diagnosed? Corns and calluses may be diagnosed based on your symptoms, your medicalhistory, and a physical exam. How is this treated? Treatment for corns and calluses may include: Removing the cause of the friction or pressure. This may involve: Changing your shoes. Wearing shoe inserts (orthotics) or other protective layers in your shoes, such as a corn pad. Wearing gloves. Applying medicine to the skin (topical medicine) to help soften skin in the hardened, thickened areas. Removing layers of dead skin with a file to reduce the size of the  corn or callus. Removing the corn or callus with a scalpel or laser. Taking antibiotic medicines, if your corn or callus is infected. Having surgery, if a toe deformity is the cause. Follow these instructions at home:  Take over-the-counter and prescription medicines only as told by your health care provider. If you were prescribed an antibiotic medicine, take it as told by your health care provider. Do not stop taking it even if your condition improves. Wear shoes that fit well. Avoid wearing high-heeled shoes and shoes that are too tight or too loose. Wear any padding, protective layers, gloves, or orthotics as told by your health care provider. Soak your hands or feet. Then use a file or pumice stone to soften your corn  or callus. Do this as told by your health care provider. Check your corn or callus every day for signs of infection. Contact a health care provider if: Your symptoms do not improve with treatment. You have redness or swelling that gets worse. Your corn or callus becomes painful. You have fluid, blood, or pus coming from your corn or callus. You have new symptoms. Get help right away if: You develop severe pain with redness. Summary Corns are small areas of thickened skin that form on the top, sides, or tip of a toe. These can be painful. Calluses are areas of thickened skin that can form anywhere on the body, including the hands, fingers, palms, and soles of the feet. Calluses are usually larger than corns. Corns and calluses are caused by rubbing (friction) or pressure, such as from shoes that are too tight or do not fit properly. Treatment may include wearing padding, protective layers, gloves, or orthotics as told by your health care provider. This information is not intended to replace advice given to you by your health care provider. Make sure you discuss any questions you have with your healthcare provider. Document Revised: 11/12/2019 Document Reviewed:  11/12/2019 Elsevier Patient Education  2022 [employer].
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
## 2021-06-12 ENCOUNTER — Ambulatory Visit: Payer: Medicare Other | Admitting: Podiatry

## 2021-08-23 ENCOUNTER — Ambulatory Visit (INDEPENDENT_AMBULATORY_CARE_PROVIDER_SITE_OTHER): Payer: Medicare Other | Admitting: Podiatry

## 2021-08-23 ENCOUNTER — Encounter: Payer: Self-pay | Admitting: Podiatry

## 2021-08-23 ENCOUNTER — Other Ambulatory Visit: Payer: Self-pay

## 2021-08-23 DIAGNOSIS — M79675 Pain in left toe(s): Secondary | ICD-10-CM | POA: Diagnosis not present

## 2021-08-23 DIAGNOSIS — M79674 Pain in right toe(s): Secondary | ICD-10-CM

## 2021-08-23 DIAGNOSIS — I739 Peripheral vascular disease, unspecified: Secondary | ICD-10-CM | POA: Diagnosis not present

## 2021-08-23 DIAGNOSIS — B351 Tinea unguium: Secondary | ICD-10-CM

## 2021-08-23 DIAGNOSIS — L84 Corns and callosities: Secondary | ICD-10-CM | POA: Diagnosis not present

## 2021-08-24 ENCOUNTER — Ambulatory Visit (INDEPENDENT_AMBULATORY_CARE_PROVIDER_SITE_OTHER): Payer: Medicare Other | Admitting: Family Medicine

## 2021-08-24 ENCOUNTER — Encounter: Payer: Self-pay | Admitting: Family Medicine

## 2021-08-24 VITALS — BP 148/70 | HR 72 | Temp 97.1°F | Ht 62.0 in | Wt 118.6 lb

## 2021-08-24 DIAGNOSIS — H9190 Unspecified hearing loss, unspecified ear: Secondary | ICD-10-CM

## 2021-08-24 DIAGNOSIS — D649 Anemia, unspecified: Secondary | ICD-10-CM | POA: Diagnosis not present

## 2021-08-24 DIAGNOSIS — F03B18 Unspecified dementia, moderate, with other behavioral disturbance: Secondary | ICD-10-CM

## 2021-08-24 DIAGNOSIS — F03B0 Unspecified dementia, moderate, without behavioral disturbance, psychotic disturbance, mood disturbance, and anxiety: Secondary | ICD-10-CM | POA: Insufficient documentation

## 2021-08-24 LAB — CBC
HCT: 36.3 % (ref 36.0–46.0)
Hemoglobin: 11.4 g/dL — ABNORMAL LOW (ref 12.0–15.0)
MCHC: 31.5 g/dL (ref 30.0–36.0)
MCV: 86.1 fl (ref 78.0–100.0)
Platelets: 142 10*3/uL — ABNORMAL LOW (ref 150.0–400.0)
RBC: 4.21 Mil/uL (ref 3.87–5.11)
RDW: 14.8 % (ref 11.5–15.5)
WBC: 2.1 10*3/uL — ABNORMAL LOW (ref 4.0–10.5)

## 2021-08-24 LAB — TSH: TSH: 1.6 u[IU]/mL (ref 0.35–5.50)

## 2021-08-24 NOTE — Progress Notes (Signed)
Established Patient Office Visit  Subjective:  Patient ID: Cassandra English, female    DOB: 21-Apr-1927  Age: 88 y.o. MRN: [id number]  CC:  Chief Complaint  Patient presents with   Annual Exam    CPE, per son needed for nursing home. Concerns about dementia becoming worse and poor appetite     HPI Cassandra English presents for a health check.  She is accompanied by her son.  History of ongoing progressive dementia.  Son is concerned because she is starting to wander and her appetite is decreased.  There is no one at the home during the day care for her.  Family has been supplementing with Ensure.  She maintains the ability to dress herself toilet and eat.  Family has been preparing meals for her.  Past Medical History:  Diagnosis Date   ALLERGIC RHINITIS    Anemia    with borderline low wbc and neg heme eval 10 years ago.    Anxiety    Asthma    Callus    Cataract    Corns and callosities    Diverticulitis     1 perf colon 10 99   GERD (gastroesophageal reflux disease)    EGD 10 99 and 7 00   HTN (hypertension)    Hyperlipidemia    Hypersomnia with sleep apnea    on CPAP   Leukopenia    chronic. hx of hem consult years ago   Multinodular goiter 2011   get yearly [place]   Syncope 2010   echo ok. cards consult     Wears glasses     Past Surgical History:  Procedure Laterality Date   CARPAL TUNNEL RELEASE Right 06/29/2013   Procedure: RIGHT CARPAL TUNNEL RELEASE;  Surgeon: Tennis Must, MD;  Location: [place] [facility];  Service: Orthopedics;  Laterality: Right;   CATARACT EXTRACTION     FOOT SURGERY Right 1990's    Hammer toe surgery   TOTAL ABDOMINAL HYSTERECTOMY      Family History  Problem Relation Age of Onset   Coronary artery disease Mother    Coronary artery disease Father    Stroke Sister    Colon cancer Neg Hx     Social History   Socioeconomic History   Marital status: Divorced    Spouse name: Not on file   Number of children: 3    Years of education: Not on file   Highest education level: Not on file  Occupational History   Occupation: Retired   Tobacco Use   Smoking status: Never   Smokeless tobacco: Never  Substance and Sexual Activity   Alcohol use: No   Drug use: No   Sexual activity: Not on file  Other Topics Concern   Not on file  Social History Narrative   Lives alone.   [facility]    Divorced 3 sons   Environmental consultant signed on    0 caffeine  Drinks daily       Brother died recently age 51s   Social Determinants of Health   Financial Resource Strain: Not on file  Food Insecurity: Not on file  Transportation Needs: Not on file  Physical Activity: Not on file  Stress: Not on file  Social Connections: Not on file  Intimate Partner Violence: Not on file    Outpatient Medications Prior to Visit  Medication Sig Dispense Refill   aspirin 81 MG tablet Take 81 mg by mouth daily.  polyethylene glycol (MIRALAX) packet Take 17 g by mouth daily. 14 each 0   NON FORMULARY CPAP machine.  Use as directed at bedtime.     No facility-administered medications prior to visit.    Allergies  Allergen Reactions   Azithromycin    Propoxyphene N-Acetaminophen    Simvastatin     REACTION: unspecified    ROS Review of Systems  Constitutional: Negative.   HENT:  Positive for hearing loss.   Eyes:  Positive for visual disturbance. Negative for photophobia.  Respiratory: Negative.    Cardiovascular: Negative.   Gastrointestinal: Negative.   Genitourinary: Negative.   Neurological:  Negative for speech difficulty and weakness.  Psychiatric/Behavioral:  Positive for confusion. Negative for agitation and behavioral problems.      Objective:    Physical Exam Vitals and nursing note reviewed.  Constitutional:      General: She is not in acute distress.    Appearance: Normal appearance. She is normal weight. She is not ill-appearing, toxic-appearing or diaphoretic.  HENT:      Head: Normocephalic and atraumatic.     Right Ear: Tympanic membrane, ear canal and external ear normal.     Left Ear: Tympanic membrane, ear canal and external ear normal.     Ears:      Mouth/Throat:     Mouth: Mucous membranes are moist.     Pharynx: Oropharynx is clear. No oropharyngeal exudate or posterior oropharyngeal erythema.  Eyes:     General:        Right eye: No discharge.        Left eye: No discharge.     Extraocular Movements: Extraocular movements intact.     Conjunctiva/sclera: Conjunctivae normal.     Pupils: Pupils are equal, round, and reactive to light.  Cardiovascular:     Rate and Rhythm: Normal rate and regular rhythm.  Pulmonary:     Effort: Pulmonary effort is normal.     Breath sounds: Normal breath sounds.  Abdominal:     General: Bowel sounds are normal.  Musculoskeletal:     Cervical back: No rigidity or tenderness.     Right lower leg: No edema.     Left lower leg: No edema.  Lymphadenopathy:     Cervical: No cervical adenopathy.  Skin:    General: Skin is warm and dry.  Neurological:     Mental Status: She is alert. Mental status is at baseline.     Comments: Oriented to person but not time or place.   Follows simple commands.  Psychiatric:        Attention and Perception: Attention normal.        Mood and Affect: Affect normal.        Speech: Speech is delayed.        Behavior: Behavior is cooperative.    BP (!) 148/70 (BP Location: Right Arm, Patient Position: Sitting, Cuff Size: Normal)    Pulse 72    Temp (!) 97.1 F (36.2 C) (Temporal)    Ht 5\' 2"  (1.575 m)    Wt 118 lb 9.6 oz (53.8 kg)    SpO2 98%    BMI 21.69 kg/m  Wt Readings from Last 3 Encounters:  08/24/21 118 lb 9.6 oz (53.8 kg)  12/15/19 119 lb 9.6 oz (54.3 kg)  09/01/19 117 lb 9.6 oz (53.3 kg)     Health Maintenance Due  Topic Date Due   Zoster Vaccines- Shingrix (1 of 2) Never done   DEXA SCAN  Never done   Pneumonia Vaccine 53+ Years old (2 - PCV)  07/30/2001   MAMMOGRAM  06/30/2013   TETANUS/TDAP  12/05/2020    There are no preventive care reminders to display for this patient.  Lab Results  Component Value Date   TSH 1.838 11/13/2017   Lab Results  Component Value Date   WBC 2.4 Repeated and verified X2. (L) 12/15/2019   HGB 11.1 (L) 12/15/2019   HCT 34.0 (L) 12/15/2019   MCV 86.5 12/15/2019   PLT 146.0 (L) 12/15/2019   Lab Results  Component Value Date   NA 142 08/14/2019   K 4.2 08/14/2019   CO2 29 08/14/2019   GLUCOSE 93 08/14/2019   BUN 19 08/14/2019   CREATININE 0.97 08/14/2019   BILITOT 0.7 07/12/2018   ALKPHOS 69 07/12/2018   AST 51 (H) 07/12/2018   ALT 44 07/12/2018   PROT 6.8 07/12/2018   ALBUMIN 3.6 07/12/2018   CALCIUM 9.2 08/14/2019   ANIONGAP 8 08/14/2019   GFR 62.31 01/02/2012   Lab Results  Component Value Date   CHOL 199 01/02/2012   Lab Results  Component Value Date   HDL 66.00 01/02/2012   Lab Results  Component Value Date   LDLCALC 113 (H) 01/02/2012   Lab Results  Component Value Date   TRIG 99.0 01/02/2012   Lab Results  Component Value Date   CHOLHDL 3 01/02/2012   Lab Results  Component Value Date   HGBA1C 6.3 11/18/2008      Assessment & Plan:   Problem List Items Addressed This Visit       Nervous and Auditory   Hearing loss - Primary   Relevant Orders   Ambulatory referral to Audiology   Moderate dementia   Relevant Orders   Ambulatory referral to [facility]     Other   Anemia   Relevant Orders   CBC   TSH    No orders of the defined types were placed in this encounter.   Follow-up: Return in about 6 months (around 02/21/2022), or if symptoms worsen or fail to improve.    Libby Maw, MD

## 2021-08-29 NOTE — Progress Notes (Signed)
°  Subjective:  Patient ID: Cassandra English, female    DOB: 1927/05/06,  MRN: [id number]  Cassandra English presents to clinic today for for at risk foot care. Patient has h/o PAD and callus(es) bilaterlly and painful thick toenails that are difficult to trim. Painful toenails interfere with ambulation. Aggravating factors include wearing enclosed shoe gear. Pain is relieved with periodic professional debridement. Painful calluses are aggravated when weightbearing with and without shoegear. Pain is relieved with periodic professional debridement.  New problem(s): None.   PCP is Libby Maw, MD , and last visit was 08/24/2020.  Allergies  Allergen Reactions   Azithromycin    Propoxyphene N-Acetaminophen    Simvastatin     REACTION: unspecified    Review of Systems: Negative except as noted in the HPI. Objective:   Constitutional Melissia STEFFI NOVIELLO is a pleasant 88 y.o. African American female, in NAD. AAO x 3.   Vascular CFT <3 seconds b/l LE. Faintly palpable DP pulses b/l. Nonpalpable PT pulses b/l. Pedal hair absent b/l. Skin temperature gradient WNL b/l. No pain with calf compression b/l. No edema b/l LE. No cyanosis or clubbing noted b/l LE.  Neurologic Normal speech. Oriented to person, place, and time. Patient unable to follow commands of LE neurological examination due to cognitive deficits. Patient does respond to external noxious stimuli.  Dermatologic Pedal skin thin and atrophic b/l LE. No open wounds b/l LE. No interdigital macerations noted b/l LE. Toenails 1-5 b/l elongated, discolored, dystrophic, thickened, crumbly with subungual debris and tenderness to dorsal palpation. Hyperkeratotic lesion(s) submet head 5 b/l.  No erythema, no edema, no drainage, no fluctuance.  Orthopedic: Muscle strength 5/5 to all lower extremity muscle groups bilaterally. No pain, crepitus or joint limitation noted with ROM bilateral LE. Hammertoe deformity noted 2-5 b/l.   Radiographs:  None   Assessment:   1. Pain due to onychomycosis of toenails of both feet   2. Callus   3. PAD (peripheral artery disease) ([place])    Plan:  Patient was evaluated and treated and all questions answered. Consent given for treatment as described below: -No new findings. No new orders. -Mycotic toenails 1-5 bilaterally were debrided in length and girth with sterile nail nippers and dremel without incident. -Callus(es) submet head 5 b/l pared utilizing sterile scalpel blade without complication or incident. Total number debrided =2. -Patient/POA to call should there be question/concern in the interim.  Return in about 3 months (around 11/21/2021).  Marzetta Board, DPM

## 2021-09-05 DIAGNOSIS — F03B18 Unspecified dementia, moderate, with other behavioral disturbance: Secondary | ICD-10-CM | POA: Diagnosis not present

## 2021-09-05 DIAGNOSIS — K5900 Constipation, unspecified: Secondary | ICD-10-CM | POA: Diagnosis not present

## 2021-09-05 DIAGNOSIS — D649 Anemia, unspecified: Secondary | ICD-10-CM | POA: Diagnosis not present

## 2021-09-05 DIAGNOSIS — Z9989 Dependence on other enabling machines and devices: Secondary | ICD-10-CM | POA: Diagnosis not present

## 2021-09-05 DIAGNOSIS — G4710 Hypersomnia, unspecified: Secondary | ICD-10-CM | POA: Diagnosis not present

## 2021-09-05 DIAGNOSIS — Z7982 Long term (current) use of aspirin: Secondary | ICD-10-CM | POA: Diagnosis not present

## 2021-09-07 ENCOUNTER — Ambulatory Visit: Payer: Medicare Other | Admitting: Audiology

## 2021-09-08 ENCOUNTER — Telehealth: Payer: Self-pay | Admitting: Family Medicine

## 2021-09-08 NOTE — Telephone Encounter (Signed)
[facility] FORMS received via fax Type of Form: _x__ CERTIFICATION       ___ RECERTIFICATION GREEN charge sheet attached and placed in MD folder at front desk. ~~~ route to CMA/provider Team

## 2021-09-12 DIAGNOSIS — Z7982 Long term (current) use of aspirin: Secondary | ICD-10-CM | POA: Diagnosis not present

## 2021-09-12 DIAGNOSIS — K5900 Constipation, unspecified: Secondary | ICD-10-CM | POA: Diagnosis not present

## 2021-09-12 DIAGNOSIS — G4710 Hypersomnia, unspecified: Secondary | ICD-10-CM | POA: Diagnosis not present

## 2021-09-12 DIAGNOSIS — F03B18 Unspecified dementia, moderate, with other behavioral disturbance: Secondary | ICD-10-CM | POA: Diagnosis not present

## 2021-09-12 DIAGNOSIS — Z9989 Dependence on other enabling machines and devices: Secondary | ICD-10-CM | POA: Diagnosis not present

## 2021-09-12 DIAGNOSIS — D649 Anemia, unspecified: Secondary | ICD-10-CM | POA: Diagnosis not present

## 2021-09-12 NOTE — Telephone Encounter (Signed)
Forms received waiting to be viewed and signed.

## 2021-09-14 ENCOUNTER — Telehealth: Payer: Self-pay | Admitting: Family Medicine

## 2021-09-14 NOTE — Telephone Encounter (Signed)
Forms received waiting to be singed by provider.

## 2021-09-14 NOTE — Telephone Encounter (Signed)
Duplicate message. 

## 2021-09-14 NOTE — Telephone Encounter (Signed)
[facility] FORMS received via fax Type of Form: _x__ CERTIFICATION       ___ RECERTIFICATION GREEN charge sheet attached and placed in MD folder at front desk. ~~~ route to CMA/provider Team

## 2021-09-15 DIAGNOSIS — D649 Anemia, unspecified: Secondary | ICD-10-CM | POA: Diagnosis not present

## 2021-09-15 DIAGNOSIS — F03B18 Unspecified dementia, moderate, with other behavioral disturbance: Secondary | ICD-10-CM | POA: Diagnosis not present

## 2021-09-15 DIAGNOSIS — Z7982 Long term (current) use of aspirin: Secondary | ICD-10-CM | POA: Diagnosis not present

## 2021-09-15 DIAGNOSIS — G4710 Hypersomnia, unspecified: Secondary | ICD-10-CM | POA: Diagnosis not present

## 2021-09-15 DIAGNOSIS — K5900 Constipation, unspecified: Secondary | ICD-10-CM | POA: Diagnosis not present

## 2021-09-15 DIAGNOSIS — Z9989 Dependence on other enabling machines and devices: Secondary | ICD-10-CM | POA: Diagnosis not present

## 2021-09-19 DIAGNOSIS — F03B18 Unspecified dementia, moderate, with other behavioral disturbance: Secondary | ICD-10-CM | POA: Diagnosis not present

## 2021-09-19 DIAGNOSIS — K5900 Constipation, unspecified: Secondary | ICD-10-CM | POA: Diagnosis not present

## 2021-09-19 DIAGNOSIS — Z9989 Dependence on other enabling machines and devices: Secondary | ICD-10-CM | POA: Diagnosis not present

## 2021-09-19 DIAGNOSIS — D649 Anemia, unspecified: Secondary | ICD-10-CM | POA: Diagnosis not present

## 2021-09-19 DIAGNOSIS — Z7982 Long term (current) use of aspirin: Secondary | ICD-10-CM | POA: Diagnosis not present

## 2021-09-19 DIAGNOSIS — G4710 Hypersomnia, unspecified: Secondary | ICD-10-CM | POA: Diagnosis not present

## 2021-09-19 NOTE — Telephone Encounter (Signed)
Forms signed and faxed.

## 2021-09-27 ENCOUNTER — Ambulatory Visit: Payer: Medicare Other | Admitting: Audiologist

## 2021-09-28 DIAGNOSIS — G4710 Hypersomnia, unspecified: Secondary | ICD-10-CM | POA: Diagnosis not present

## 2021-09-28 DIAGNOSIS — D649 Anemia, unspecified: Secondary | ICD-10-CM | POA: Diagnosis not present

## 2021-09-28 DIAGNOSIS — K5900 Constipation, unspecified: Secondary | ICD-10-CM | POA: Diagnosis not present

## 2021-09-28 DIAGNOSIS — Z9989 Dependence on other enabling machines and devices: Secondary | ICD-10-CM | POA: Diagnosis not present

## 2021-09-28 DIAGNOSIS — Z7982 Long term (current) use of aspirin: Secondary | ICD-10-CM | POA: Diagnosis not present

## 2021-09-28 DIAGNOSIS — F03B18 Unspecified dementia, moderate, with other behavioral disturbance: Secondary | ICD-10-CM | POA: Diagnosis not present

## 2021-11-22 ENCOUNTER — Ambulatory Visit (INDEPENDENT_AMBULATORY_CARE_PROVIDER_SITE_OTHER): Payer: Self-pay | Admitting: Podiatry

## 2021-11-22 DIAGNOSIS — Z91199 Patient's noncompliance with other medical treatment and regimen due to unspecified reason: Secondary | ICD-10-CM

## 2021-11-22 NOTE — Progress Notes (Signed)
   Complete physical exam  Patient: Cassandra English   DOB: 05/19/1997   88 y.o. Female  MRN: [id number]  Subjective:    No chief complaint on file.   Cassandra English is a 88 y.o. female who presents today for a complete physical exam. She reports consuming a {diet types:17450} diet. {types:19826} She generally feels {DESC; WELL/FAIRLY WELL/POORLY:18703}. She reports sleeping {DESC; WELL/FAIRLY WELL/POORLY:18703}. She {does/does not:200015} have additional problems to discuss today.    Most recent fall risk assessment:    01/24/2022   10:42 AM  Fall Risk   Falls in the past year? 0  Number falls in past yr: 0  Injury with Fall? 0  Risk for fall due to : No Fall Risks  Follow up Falls evaluation completed     Most recent depression screenings:    01/24/2022   10:42 AM 12/15/2020   10:46 AM  PHQ 2/9 Scores  PHQ - 2 Score 0 0  PHQ- 9 Score 5     {VISON DENTAL STD PSA (Optional):27386}  {History (Optional):23778}  Patient Care Team: Jessup, Joy, NP as PCP - General (Nurse Practitioner)   Outpatient Medications Prior to Visit  Medication Sig   fluticasone (FLONASE) 50 MCG/ACT nasal spray Place 2 sprays into both nostrils in the morning and at bedtime. After 7 days, reduce to once daily.   norgestimate-ethinyl estradiol (SPRINTEC 28) 0.25-35 MG-MCG tablet Take 1 tablet by mouth daily.   Nystatin POWD Apply liberally to affected area 2 times per day   spironolactone (ALDACTONE) 100 MG tablet Take 1 tablet (100 mg total) by mouth daily.   No facility-administered medications prior to visit.    ROS        Objective:     There were no vitals taken for this visit. {Vitals History (Optional):23777}  Physical Exam   No results found for any visits on 03/01/22. {Show previous labs (optional):23779}    Assessment & Plan:    Routine Health Maintenance and Physical Exam  Immunization History  Administered Date(s) Administered   DTaP 08/02/1999, 09/28/1999,  12/07/1999, 08/22/2000, 03/07/2004   Hepatitis A 01/02/2008, 01/07/2009   Hepatitis B 05/20/1999, 06/27/1999, 12/07/1999   HiB (PRP-OMP) 08/02/1999, 09/28/1999, 12/07/1999, 08/22/2000   IPV 08/02/1999, 09/28/1999, 05/27/2000, 03/07/2004   Influenza,inj,Quad PF,6+ Mos 04/09/2014   Influenza-Unspecified 07/09/2012   MMR 05/27/2001, 03/07/2004   Meningococcal Polysaccharide 01/07/2012   Pneumococcal Conjugate-13 08/22/2000   Pneumococcal-Unspecified 12/07/1999, 02/20/2000   Tdap 01/07/2012   Varicella 05/27/2000, 01/02/2008    Health Maintenance  Topic Date Due   HIV Screening  Never done   Hepatitis C Screening  Never done   INFLUENZA VACCINE  02/27/2022   PAP-Cervical Cytology Screening  03/01/2022 (Originally 05/18/2020)   PAP SMEAR-Modifier  03/01/2022 (Originally 05/18/2020)   TETANUS/TDAP  03/01/2022 (Originally 01/06/2022)   HPV VACCINES  Discontinued   COVID-19 Vaccine  Discontinued    Discussed health benefits of physical activity, and encouraged her to engage in regular exercise appropriate for her age and condition.  Problem List Items Addressed This Visit   None Visit Diagnoses     Annual physical exam    -  Primary   Cervical cancer screening       Need for Tdap vaccination          No follow-ups on file.     Joy Jessup, NP   

## 2022-01-02 ENCOUNTER — Telehealth: Payer: Self-pay | Admitting: Family Medicine

## 2022-02-09 NOTE — Telephone Encounter (Signed)
error 

## 2022-02-19 ENCOUNTER — Telehealth: Payer: Self-pay | Admitting: Family Medicine

## 2022-02-19 NOTE — Telephone Encounter (Signed)
Left message for patient's son, Sharlene Motts call back and schedule Medicare Annual Wellness Visit (AWV).   Please offer to do virtually or by telephone.  Left office number and my jabber [phone].  Last AWV:01/02/2012  Please schedule at anytime with Nurse Health Advisor.

## 2022-02-21 ENCOUNTER — Ambulatory Visit: Payer: Medicare Other | Admitting: Family Medicine

## 2022-03-27 ENCOUNTER — Telehealth: Payer: Self-pay

## 2022-03-27 NOTE — Telephone Encounter (Signed)
Left message for patient's son, Sharlene Motts call back and schedule Medicare Annual Wellness Visit (AWV).    Please offer to do virtually or by telephone.  Left office number and my jabber [phone].  Last AWV:01/02/2012   Please schedule at anytime with Nurse Health Advisor.

## 2022-04-13 ENCOUNTER — Telehealth: Payer: Self-pay | Admitting: Family Medicine

## 2022-04-13 NOTE — Telephone Encounter (Signed)
Left message for patient's son, Edison Nasuti, to call back and schedule Medicare Annual Wellness Visit (AWV).   Please offer to do virtually or by telephone.  Left office number and my jabber [phone].  Last AWV:01/02/2012  Please schedule at anytime with Nurse Health Advisor.

## 2022-07-24 ENCOUNTER — Telehealth: Payer: Self-pay

## 2022-07-24 NOTE — Patient Outreach (Signed)
  Care Coordination   07/24/2022 Name: TYJAH HAI MRN: [id number] DOB: 1926-11-01   Care Coordination Outreach Attempts:  An unsuccessful telephone outreach was attempted today to offer the patient information about available care coordination services as a benefit of their health plan.   Follow Up Plan:  Additional outreach attempts will be made to offer the patient care coordination information and services.   Encounter Outcome:  No Answer   Care Coordination Interventions:  No, not indicated    Jone Baseman, RN, MSN [facility] Care Management Coordinator Direct Line [phone]

## 2022-08-08 ENCOUNTER — Telehealth: Payer: Self-pay

## 2022-08-08 NOTE — Patient Outreach (Signed)
  Care Coordination   08/08/2022 Name: KARIE SKOWRON MRN: [id number] DOB: April 05, 1927   Care Coordination Outreach Attempts:  A second unsuccessful outreach was attempted today to offer the patient with information about available care coordination services as a benefit of their health plan.     Follow Up Plan:  Additional outreach attempts will be made to offer the patient care coordination information and services.   Encounter Outcome:  No Answer   Care Coordination Interventions:  No, not indicated    Jone Baseman, RN, MSN [facility] Care Management Coordinator Direct Line [phone]

## 2022-09-03 ENCOUNTER — Ambulatory Visit (INDEPENDENT_AMBULATORY_CARE_PROVIDER_SITE_OTHER): Payer: Medicare Other

## 2022-09-03 VITALS — BP 130/70 | HR 71 | Temp 98.4°F | Ht 65.0 in | Wt 131.2 lb

## 2022-09-03 DIAGNOSIS — Z0000 Encounter for general adult medical examination without abnormal findings: Secondary | ICD-10-CM

## 2022-09-03 NOTE — Progress Notes (Signed)
Subjective:   Delila SHANEQUA WHITENIGHT is a 89 y.o. female who presents for Medicare Annual (Subsequent) preventive examination.  Review of Systems     Cardiac Risk Factors include: advanced age (>37mn, >>75women);dyslipidemia     Objective:    Today's Vitals   09/03/22 1015 09/03/22 1037  BP: (!) 150/70 130/70  Pulse: 71   Temp: 98.4 F (36.9 C)   TempSrc: Oral   SpO2: 99%   Weight: 131 lb 3.2 oz (59.5 kg)   Height: '5\' 5"'$  (1.651 m)    Body mass index is 21.83 kg/m.     09/03/2022   10:24 AM 07/19/2018    8:03 AM 06/22/2013    3:33 PM 06/14/2011    8:09 AM  Advanced Directives  Does Patient Have a Medical Advance Directive? No No Patient does not have advance directive;Patient would not like information Patient does not have advance directive  Would patient like information on creating a medical advance directive?  No - Patient declined    Pre-existing out of facility DNR order (yellow form or pink MOST form)    No    Current Medications (verified) Outpatient Encounter Medications as of 09/03/2022  Medication Sig   aspirin 81 MG tablet Take 81 mg by mouth daily.     polyethylene glycol (MIRALAX) packet Take 17 g by mouth daily.   No facility-administered encounter medications on file as of 09/03/2022.    Allergies (verified) Azithromycin, Propoxyphene n-acetaminophen, and Simvastatin   History: Past Medical History:  Diagnosis Date   ALLERGIC RHINITIS    Anemia    with borderline low wbc and neg heme eval 10 years ago.    Anxiety    Asthma    Callus    Cataract    Corns and callosities    Diverticulitis     1 perf colon 10 99   GERD (gastroesophageal reflux disease)    EGD 10 99 and 7 00   HTN (hypertension)    Hyperlipidemia    Hypersomnia with sleep apnea    on CPAP   Leukopenia    chronic. hx of hem consult years ago   Multinodular goiter 2011   get yearly [place]  Syncope 2010   echo ok. cards consult     Wears glasses    Past Surgical History:   Procedure Laterality Date   CARPAL TUNNEL RELEASE Right 06/29/2013   Procedure: RIGHT CARPAL TUNNEL RELEASE;  Surgeon: KTennis Must MD;  [place] [facility]  Service: Orthopedics;  Laterality: Right;   CATARACT EXTRACTION     FOOT SURGERY Right 1990's    Hammer toe surgery   TOTAL ABDOMINAL HYSTERECTOMY     Family History  Problem Relation Age of Onset   Coronary artery disease Mother    Coronary artery disease Father    Stroke Sister    Colon cancer Neg Hx    Social History   Socioeconomic History   Marital status: Divorced    Spouse name: Not on file   Number of children: 3   Years of education: Not on file   Highest education level: Not on file  Occupational History   Occupation: Retired   Tobacco Use   Smoking status: Never   Smokeless tobacco: Never  Substance and Sexual Activity   Alcohol use: No   Drug use: No   Sexual activity: Not on file  Other Topics Concern   Not on file  Social History Narrative   L[place]  alone.   [place]    Divorced 3 sons   Environmental consultant signed on    0 caffeine  Drinks daily       Brother died recently age 82s   Social Determinants of Health   Financial Resource Strain: Low Risk  (09/03/2022)   Overall Financial Resource Strain (CARDIA)    Difficulty of Paying Living Expenses: Not hard at all  Food Insecurity: No Food Insecurity (09/03/2022)   Hunger Vital Sign    Worried About Running Out of Food in the Last Year: Never true    Ran Out of Food in the Last Year: Never true  Transportation Needs: No Transportation Needs (09/03/2022)   PRAPARE - Hydrologist (Medical): No    Lack of Transportation (Non-Medical): No  Physical Activity: Inactive (09/03/2022)   Exercise Vital Sign    Days of Exercise per Week: 0 days    Minutes of Exercise per Session: 0 min  Stress: Stress Concern Present (09/03/2022)   [place]    Feeling of Stress : To some extent  Social Connections: Not on file    Tobacco Counseling Counseling given: Not Answered   Clinical Intake:  Pre-visit preparation completed: Yes  Pain : No/denies pain     Nutritional Status: BMI of 19-24  Normal Nutritional Risks: None Diabetes: No  How often do you need to have someone help you when you read instructions, pamphlets, or other written materials from your doctor or pharmacy?: 5 - Always  Diabetic? no  Interpreter Needed?: No  Information entered by :: NAllen LPN   Activities of Daily Living    09/03/2022   10:26 AM  In your present state of health, do you have any difficulty performing the following activities:  Hearing? 1  Vision? 0  Difficulty concentrating or making decisions? 1  Walking or climbing stairs? 0  Dressing or bathing? 0  Doing errands, shopping? 1  Preparing Food and eating ? Y  Using the Toilet? N  In the past six months, have you accidently leaked urine? N  Do you have problems with loss of bowel control? N  Managing your Medications? Y  Managing your Finances? Y  Housekeeping or managing your Housekeeping? Y    Patient Care Team: Libby Maw, MD as PCP - General (Family Medicine) Rigoberto Noel, MD (Pulmonary Disease) Milus Banister, MD (Gastroenterology) allergy  dr Velora Heckler in past  Indicate any recent Medical Services you may have received from other than [facility] providers in the past year (date may be approximate).     Assessment:   This is a routine wellness examination for Ice[place].  Hearing/Vision screen Vision Screening - Comments:: No regular eye exams  Dietary issues and exercise activities discussed: Current Exercise Habits: The patient does not participate in regular exercise at present   Goals Addressed             This Visit's Progress    Patient Stated       09/03/2022, no goals       Depression Screen    09/03/2022   10:25 AM  09/01/2019    9:26 AM  PHQ 2/9 Scores  PHQ - 2 Score 0 0    Fall Risk    09/03/2022   10:25 AM 09/01/2019    9:25 AM  Fall Risk   Falls in the past year? 0 0  Number falls  in past yr: 0   Injury with Fall? 0   Risk for fall due to : Impaired balance/gait   Follow up Falls prevention discussed;Education provided;Falls evaluation completed     FALL RISK PREVENTION PERTAINING TO THE HOME:  Any stairs in or around the home? Yes  If so, are there any without handrails? No  Home free of loose throw rugs in walkways, pet beds, electrical cords, etc? Yes  Adequate lighting in your home to reduce risk of falls? Yes   ASSISTIVE DEVICES UTILIZED TO PREVENT FALLS:  Life alert? No  Use of a cane, walker or w/c? No  Grab bars in the bathroom? No  Shower chair or bench in shower? Yes  Elevated toilet seat or a handicapped toilet? No   TIMED UP AND GO:  Was the test performed? Yes .  Length of time to ambulate 10 feet: 7 sec.    Gait slow and steady without use of assistive device  Cognitive Function:   6 CIT not administered. Patient has a diagnosis of dementia.      Immunizations Immunization History  Administered Date(s) Administered   Fluad Quad(high Dose 65+) 09/01/2019   Influenza Split 04/03/2012   Influenza Whole 04/14/2008, 05/02/2009, 05/01/2010   Influenza, High Dose Seasonal PF 05/30/2016, 05/09/2017   Influenza,inj,Quad PF,6+ Mos 04/25/2015   Influenza-Unspecified 05/02/2015   Pneumococcal Polysaccharide-23 07/30/2000   Td 07/30/1996   Tdap 12/06/2010   Flu Vaccine status: Declined, Education has been provided regarding the importance of this vaccine but patient still declined. Advised may receive this vaccine at local pharmacy or Health Dept. Aware to provide a copy of the vaccination record if obtained from local pharmacy or Health Dept. Verbalized acceptance and understanding. TDAP status: Due, Education has been provided regarding the importance of this  vaccine. Advised may receive this vaccine at local pharmacy or Health Dept. Aware to provide a copy of the vaccination record if obtained from local pharmacy or Health Dept. Verbalized acceptance and understanding.  Flu Vaccine status: Declined, Education has been provided regarding the importance of this vaccine but patient still declined. Advised may receive this vaccine at local pharmacy or Health Dept. Aware to provide a copy of the vaccination record if obtained from local pharmacy or Health Dept. Verbalized acceptance and understanding.  Pneumococcal vaccine status: Declined,  Education has been provided regarding the importance of this vaccine but patient still declined. Advised may receive this vaccine at local pharmacy or Health Dept. Aware to provide a copy of the vaccination record if obtained from local pharmacy or Health Dept. Verbalized acceptance and understanding.   Covid-19 vaccine status: Declined, Education has been provided regarding the importance of this vaccine but patient still declined. Advised may receive this vaccine at local pharmacy or Health Dept.or vaccine clinic. Aware to provide a copy of the vaccination record if obtained from local pharmacy or Health Dept. Verbalized acceptance and understanding.  Qualifies for Shingles Vaccine? Yes   Zostavax completed No   Shingrix Completed?: No.    Education has been provided regarding the importance of this vaccine. Patient has been advised to call insurance company to determine out of pocket expense if they have not yet received this vaccine. Advised may also receive vaccine at local pharmacy or Health Dept. Verbalized acceptance and understanding.  Screening Tests Health Maintenance  Topic Date Due   DEXA SCAN  Never done   DTaP/Tdap/Td (3 - Td or Tdap) 12/05/2020   Zoster Vaccines- Shingrix (1 of 2) 12/02/2022 (Originally  01/12/1977)   Pneumonia Vaccine 80+ Years old (2 - PCV) 09/04/2023 (Originally 07/30/2001)   MAMMOGRAM   09/04/2023 (Originally 06/30/2013)   Medicare Annual Wellness (AWV)  09/04/2023   HPV VACCINES  Aged Out   INFLUENZA VACCINE  Discontinued   COVID-19 Vaccine  Discontinued    Health Maintenance  Health Maintenance Due  Topic Date Due   DEXA SCAN  Never done   DTaP/Tdap/Td (3 - Td or Tdap) 12/05/2020    Colorectal cancer screening: No longer required.   Mammogram status: No longer required due to age.  Bone Density status: due  Lung Cancer Screening: (Low Dose CT Chest recommended if Age 28-80 years, 30 pack-year currently smoking OR have quit w/in 15years.) does not qualify.   Lung Cancer Screening Referral: no  Additional Screening:  Hepatitis C Screening: does not qualify;   Vision Screening: Recommended annual ophthalmology exams for early detection of glaucoma and other disorders of the eye. Is the patient up to date with their annual eye exam?  No  Who is the provider or what is the name of the office in which the patient attends annual eye exams? none If pt is not established with a provider, would they like to be referred to a provider to establish care? No .   Dental Screening: Recommended annual dental exams for proper oral hygiene  Community Resource Referral / Chronic Care Management: CRR required this visit?  No   CCM required this visit?  No      Plan:     I have personally reviewed and noted the following in the patient's chart:   Medical and social history Use of alcohol, tobacco or illicit drugs  Current medications and supplements including opioid prescriptions. Patient is not currently taking opioid prescriptions. Functional ability and status Nutritional status Physical activity Advanced directives List of other physicians Hospitalizations, surgeries, and ER visits in previous 12 months Vitals Screenings to include cognitive, depression, and falls Referrals and appointments  In addition, I have reviewed and discussed with patient certain  preventive protocols, quality metrics, and best practice recommendations. A written personalized care plan for preventive services as well as general preventive health recommendations were provided to patient.     Kellie Simmering, LPN   11/30/6142   Nurse Notes: none

## 2022-09-03 NOTE — Patient Instructions (Signed)
Ms. Afonso , Thank you for taking time to come for your Medicare Wellness Visit. I appreciate your ongoing commitment to your health goals. Please review the following plan we discussed and let me know if I can assist you in the future.   These are the goals we discussed:  Goals      Patient Stated     09/03/2022, no goals        This is a list of the screening recommended for you and due dates:  Health Maintenance  Topic Date Due   DEXA scan (bone density measurement)  Never done   DTaP/Tdap/Td vaccine (3 - Td or Tdap) 12/05/2020   Zoster (Shingles) Vaccine (1 of 2) 12/02/2022*   Pneumonia Vaccine (2 - PCV) 09/04/2023*   Mammogram  09/04/2023*   Medicare Annual Wellness Visit  09/04/2023   HPV Vaccine  Aged Out   Flu Shot  Discontinued   COVID-19 Vaccine  Discontinued  *Topic was postponed. The date shown is not the original due date.    Advanced directives: Advance directive discussed with you today. Even though you declined this today please call our office should you change your mind and we can give you the proper paperwork for you to fill out.  Conditions/risks identified: none  Next appointment: Follow up in one year for your annual wellness visit    Preventive Care 65 Years and Older, Female Preventive care refers to lifestyle choices and visits with your health care provider that can promote health and wellness. What does preventive care include? A yearly physical exam. This is also called an annual well check. Dental exams once or twice a year. Routine eye exams. Ask your health care provider how often you should have your eyes checked. Personal lifestyle choices, including: Daily care of your teeth and gums. Regular physical activity. Eating a healthy diet. Avoiding tobacco and drug use. Limiting alcohol use. Practicing safe sex. Taking low-dose aspirin every day. Taking vitamin and mineral supplements as recommended by your health care provider. What happens  during an annual well check? The services and screenings done by your health care provider during your annual well check will depend on your age, overall health, lifestyle risk factors, and family history of disease. Counseling  Your health care provider may ask you questions about your: Alcohol use. Tobacco use. Drug use. Emotional well-being. Home and relationship well-being. Sexual activity. Eating habits. History of falls. Memory and ability to understand (cognition). Work and work Statistician. Reproductive health. Screening  You may have the following tests or measurements: Height, weight, and BMI. Blood pressure. Lipid and cholesterol levels. These may be checked every 5 years, or more frequently if you are over 39 years old. Skin check. Lung cancer screening. You may have this screening every year starting at age 18 if you have a 30-pack-year history of smoking and currently smoke or have quit within the past 15 years. Fecal occult blood test (FOBT) of the stool. You may have this test every year starting at age 67. Flexible sigmoidoscopy or colonoscopy. You may have a sigmoidoscopy every 5 years or a colonoscopy every 10 years starting at age 30. Hepatitis C blood test. Hepatitis B blood test. Sexually transmitted disease (STD) testing. Diabetes screening. This is done by checking your blood sugar (glucose) after you have not eaten for a while (fasting). You may have this done every 1-3 years. Bone density scan. This is done to screen for osteoporosis. You may have this done starting at age  65. Mammogram. This may be done every 1-2 years. Talk to your health care provider about how often you should have regular mammograms. Talk with your health care provider about your test results, treatment options, and if necessary, the need for more tests. Vaccines  Your health care provider may recommend certain vaccines, such as: Influenza vaccine. This is recommended every  year. Tetanus, diphtheria, and acellular pertussis (Tdap, Td) vaccine. You may need a Td booster every 10 years. Zoster vaccine. You may need this after age 37. Pneumococcal 13-valent conjugate (PCV13) vaccine. One dose is recommended after age 76. Pneumococcal polysaccharide (PPSV23) vaccine. One dose is recommended after age 23. Talk to your health care provider about which screenings and vaccines you need and how often you need them. This information is not intended to replace advice given to you by your health care provider. Make sure you discuss any questions you have with your health care provider. Document Released: 08/12/2015 Document Revised: 04/04/2016 Document Reviewed: 05/17/2015 Elsevier Interactive Patient Education  2017 [place] Prevention in the Home Falls can cause injuries. They can happen to people of all ages. There are many things you can do to make your home safe and to help prevent falls. What can I do on the outside of my home? Regularly fix the edges of walkways and driveways and fix any cracks. Remove anything that might make you trip as you walk through a door, such as a raised step or threshold. Trim any bushes or trees on the path to your home. Use bright outdoor lighting. Clear any walking paths of anything that might make someone trip, such as rocks or tools. Regularly check to see if handrails are loose or broken. Make sure that both sides of any steps have handrails. Any raised decks and porches should have guardrails on the edges. Have any leaves, snow, or ice cleared regularly. Use sand or salt on walking paths during winter. Clean up any spills in your garage right away. This includes oil or grease spills. What can I do in the bathroom? Use night lights. Install grab bars by the toilet and in the tub and shower. Do not use towel bars as grab bars. Use non-skid mats or decals in the tub or shower. If you need to sit down in the shower, use a  plastic, non-slip stool. Keep the floor dry. Clean up any water that spills on the floor as soon as it happens. Remove soap buildup in the tub or shower regularly. Attach bath mats securely with double-sided non-slip rug tape. Do not have throw rugs and other things on the floor that can make you trip. What can I do in the bedroom? Use night lights. Make sure that you have a light by your bed that is easy to reach. Do not use any sheets or blankets that are too big for your bed. They should not hang down onto the floor. Have a firm chair that has side arms. You can use this for support while you get dressed. Do not have throw rugs and other things on the floor that can make you trip. What can I do in the kitchen? Clean up any spills right away. Avoid walking on wet floors. Keep items that you use a lot in easy-to-reach places. If you need to reach something above you, use a strong step stool that has a grab bar. Keep electrical cords out of the way. Do not use floor polish or wax that makes floors slippery.  If you must use wax, use non-skid floor wax. Do not have throw rugs and other things on the floor that can make you trip. What can I do with my stairs? Do not leave any items on the stairs. Make sure that there are handrails on both sides of the stairs and use them. Fix handrails that are broken or loose. Make sure that handrails are as long as the stairways. Check any carpeting to make sure that it is firmly attached to the stairs. Fix any carpet that is loose or worn. Avoid having throw rugs at the top or bottom of the stairs. If you do have throw rugs, attach them to the floor with carpet tape. Make sure that you have a light switch at the top of the stairs and the bottom of the stairs. If you do not have them, ask someone to add them for you. What else can I do to help prevent falls? Wear shoes that: Do not have high heels. Have rubber bottoms. Are comfortable and fit you  well. Are closed at the toe. Do not wear sandals. If you use a stepladder: Make sure that it is fully opened. Do not climb a closed stepladder. Make sure that both sides of the stepladder are locked into place. Ask someone to hold it for you, if possible. Clearly mark and make sure that you can see: Any grab bars or handrails. First and last steps. Where the edge of each step is. Use tools that help you move around (mobility aids) if they are needed. These include: Canes. Walkers. Scooters. Crutches. Turn on the lights when you go into a dark area. Replace any light bulbs as soon as they burn out. Set up your furniture so you have a clear path. Avoid moving your furniture around. If any of your floors are uneven, fix them. If there are any pets around you, be aware of where they are. Review your medicines with your doctor. Some medicines can make you feel dizzy. This can increase your chance of falling. Ask your doctor what other things that you can do to help prevent falls. This information is not intended to replace advice given to you by your health care provider. Make sure you discuss any questions you have with your health care provider. Document Released: 05/12/2009 Document Revised: 12/22/2015 Document Reviewed: 08/20/2014 Elsevier Interactive Patient Education  2017 [employer].

## 2022-10-15 ENCOUNTER — Telehealth: Payer: Self-pay | Admitting: *Deleted

## 2022-10-15 NOTE — Patient Outreach (Signed)
  Care Coordination   10/15/2022 Name: Cassandra English MRN: [id number] DOB: 1927-01-21   Care Coordination Outreach Attempts:  An unsuccessful telephone outreach was attempted today to offer the patient information about available care coordination services as a benefit of their health plan.   Follow Up Plan:  Additional outreach attempts will be made to offer the patient care coordination information and services.   Encounter Outcome:  No Answer   Care Coordination Interventions:  No, not indicated   Eduard Clos, MSW, Force Worker [facility] [phone]

## 2023-10-04 ENCOUNTER — Telehealth: Payer: Self-pay

## 2023-10-04 NOTE — Telephone Encounter (Signed)
 Copied from CRM [id number]. Topic: General - Other >> Oct 03, 2023  3:26 PM Kathryne Eriksson wrote: Reason for CRM: Requesting In Home Care

## 2023-10-04 NOTE — Telephone Encounter (Signed)
 Forwarding message below. LOV 08/24/2021.
